# Patient Record
Sex: Female | Born: 1960 | Race: White | Hispanic: No | Marital: Single | Smoking: Former smoker
Health system: Southern US, Community
[De-identification: ages and names within clinical notes are randomized; demographics above are authoritative.]

## PROBLEM LIST (undated history)

## (undated) DIAGNOSIS — J45909 Unspecified asthma, uncomplicated: Secondary | ICD-10-CM

## (undated) DIAGNOSIS — T7840XA Allergy, unspecified, initial encounter: Secondary | ICD-10-CM

## (undated) DIAGNOSIS — Z87442 Personal history of urinary calculi: Secondary | ICD-10-CM

## (undated) DIAGNOSIS — G4730 Sleep apnea, unspecified: Secondary | ICD-10-CM

## (undated) DIAGNOSIS — M1990 Unspecified osteoarthritis, unspecified site: Secondary | ICD-10-CM

## (undated) HISTORY — PX: OTHER SURGICAL HISTORY: SHX169

## (undated) HISTORY — DX: Unspecified osteoarthritis, unspecified site: M19.90

## (undated) HISTORY — DX: Unspecified asthma, uncomplicated: J45.909

## (undated) HISTORY — PX: FRACTURE SURGERY: SHX138

## (undated) HISTORY — PX: HERNIA REPAIR: SHX51

## (undated) HISTORY — DX: Allergy, unspecified, initial encounter: T78.40XA

## (undated) HISTORY — PX: CHOLECYSTECTOMY: SHX55

---

## 1999-06-13 ENCOUNTER — Other Ambulatory Visit: Admission: RE | Admit: 1999-06-13 | Discharge: 1999-06-13 | Payer: Self-pay | Admitting: Gynecology

## 2000-06-04 ENCOUNTER — Encounter: Payer: Self-pay | Admitting: Orthopaedic Surgery

## 2000-06-05 ENCOUNTER — Ambulatory Visit (HOSPITAL_COMMUNITY): Admission: RE | Admit: 2000-06-05 | Discharge: 2000-06-05 | Payer: Self-pay | Admitting: Orthopaedic Surgery

## 2000-06-05 ENCOUNTER — Encounter: Payer: Self-pay | Admitting: Orthopaedic Surgery

## 2012-02-06 ENCOUNTER — Ambulatory Visit: Payer: Self-pay

## 2012-02-06 ENCOUNTER — Ambulatory Visit: Payer: Self-pay | Admitting: Family Medicine

## 2012-02-06 VITALS — BP 136/74 | HR 102 | Temp 97.1°F | Resp 18 | Ht 67.0 in | Wt 333.8 lb

## 2012-02-06 DIAGNOSIS — M25469 Effusion, unspecified knee: Secondary | ICD-10-CM

## 2012-02-06 DIAGNOSIS — R609 Edema, unspecified: Secondary | ICD-10-CM

## 2012-02-06 DIAGNOSIS — M25569 Pain in unspecified knee: Secondary | ICD-10-CM

## 2012-02-06 NOTE — Patient Instructions (Signed)
See the handout for exercises to start in 2 weeks if improving.  Avoid excessive bending, squatting or twisting of area for next 2 weeks. Wear ace bandage as needed.  Return to the clinic or go to the nearest emergency room if any of your symptoms worsen or new symptoms occur.

## 2012-02-06 NOTE — Progress Notes (Signed)
Subjective:    Patient ID: Wendy Lee, female    DOB: [birth year], 51 y.o.   MRN: [id number]  HPI Wendy Lee is a 51 y.o. female Hx of R knee pain - started 2 and 1/2 weeks ago - tightness in R thigh and upper knee.  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 Now with pain in both calf and knee. Leg is swollen into lower leg by end of day, tight and hard to bend. No locking, giving way. Puffy in knee at end of night.  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 Meniscus tear on L then.   Tx: alleve, ibuprofen - no relief.   No known hx of blood clots. Hx of 4 hernia surgeries - abdominal - last one 3 years ago. No recent prolonged car travel or air travel, no recent calf pain, but swelling into lower leg by end of day. No chest pains or trouble breathing.   Quit smoking 4 years ago - smoked for 10 years prior.   Review of Systems  Constitutional: Negative for fever and chills.  Respiratory: Negative for chest tightness and shortness of breath.   Cardiovascular: Positive for leg swelling (at knee. ). Negative for chest pain.  Musculoskeletal: Positive for joint swelling and arthralgias.  Skin: Negative for color change and rash.       Objective:   Physical Exam  Constitutional: She is oriented to person, place, and time. She appears well-developed and well-nourished.       Overweight/obese.   HENT:  Head: Normocephalic and atraumatic.  Cardiovascular: Normal rate, normal heart sounds and intact distal pulses.   Pulmonary/Chest: Effort normal.  Musculoskeletal:       Right knee: She exhibits decreased range of motion (decreased flexion, near full extension. ), swelling, effusion and bony tenderness. She exhibits no MCL laxity. tenderness found. Medial joint line tenderness noted. No MCL and no patellar tendon tenderness noted.       Right lower leg: She exhibits no swelling and no edema.       Legs: Neurological: She is alert and oriented to person,  place, and time.  Skin: Skin is warm and dry. No rash noted.   [facility] reading (PRIMARY) by  Dr. Neva Seat: min medial djd..    Risks (including but not limited to bleeding and infection), benefits, and alternatives discussed for R knee aspiration and injection. Discussed possible difficulty with access due to body habitius.  Verbal consent obtained after any questions were answered. Landmarks noted, and marked as needed. Inferolateral approach.   Area cleansed with Betadine x3, ethyl chloride spray for topical anesthesia, followed by alcohol swab.  Aspirated with 22 gauge syringe - 50cc clear yellow fluid. Hemostat swap of syringe,  Injected with 1 cc kenalog 40mg , 3cc lidocaine 1%plain .  No complications. Bandage applied. Ace wrap applied.   RTC precautions discussed in regards to injection.      Assessment & Plan:  Wendy Lee is a 51 y.o. female 1. Knee pain  DG Knee 1-2 Views Right  2. Swelling of knee joint  DG Knee 1-2 Views Right   R knee pain and swelling with tightness into thigh. 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 Options discussed including ortho eval, or compression with NSAid, but chose aspiration and injection as above as this is limiting her ability to work. No complications with injection. Slight improvement afterwards.   Handout on meniscus tear given. Modify activity next 2  weeks - recheck if not improved. rtc precautions given.

## 2012-02-12 ENCOUNTER — Telehealth: Payer: Self-pay

## 2012-02-12 NOTE — Telephone Encounter (Signed)
R knee pain and swelling with tightness into thigh. 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 Options discussed including ortho eval, or compression with NSAid, but chose aspiration and injection as above as this is limiting her ability to work. No complications with injection. Slight improvement afterwards.  Handout on meniscus tear given. Modify activity next 2 weeks - recheck if not improved. rtc precautions given.   Please advise, patient requesting meds for knee pain and swelling

## 2012-02-12 NOTE — Telephone Encounter (Signed)
The patient was seen on 02/06/12 by Dr. Neva Seat and is continuing to have knee pain and swelling.  The patient also would like to know how many times can an knee have fluid drawn off of it.  The patient is requesting pain medication for knee pain and states Tylenol and Ibuprofen are not helping enough.  Please call the patient at [phone].

## 2012-02-13 MED ORDER — HYDROCODONE-ACETAMINOPHEN 5-325 MG PO TABS: 1.0000 | ORAL_TABLET | Freq: Four times a day (QID) | ORAL | Status: DC | PRN

## 2012-02-13 NOTE — Telephone Encounter (Signed)
Spoke with Dr. Neva Seat. We can go ahead and manage her discomfort with some pain medication, but he would also like to see her again since she is still having some pain and swelling. Medication printed and at the TL desk.

## 2012-02-13 NOTE — Telephone Encounter (Signed)
Spoke with Wendy Lee advised Rx at pharmacy. Advised to recheck with Dr Neva Seat since she is still having pain. Wendy Lee understood

## 2012-02-25 ENCOUNTER — Ambulatory Visit (INDEPENDENT_AMBULATORY_CARE_PROVIDER_SITE_OTHER): Payer: BC Managed Care – PPO | Admitting: Family Medicine

## 2012-02-25 VITALS — BP 138/86 | HR 72 | Temp 98.0°F | Resp 16 | Ht 67.0 in | Wt 334.0 lb

## 2012-02-25 DIAGNOSIS — M1710 Unilateral primary osteoarthritis, unspecified knee: Secondary | ICD-10-CM

## 2012-02-25 DIAGNOSIS — M25469 Effusion, unspecified knee: Secondary | ICD-10-CM

## 2012-02-25 DIAGNOSIS — M25569 Pain in unspecified knee: Secondary | ICD-10-CM

## 2012-02-25 DIAGNOSIS — IMO0002 Reserved for concepts with insufficient information to code with codable children: Secondary | ICD-10-CM

## 2012-02-25 MED ORDER — MELOXICAM 7.5 MG PO TABS: 7.5000 mg | ORAL_TABLET | Freq: Every day | ORAL | Status: DC

## 2012-02-25 MED ORDER — HYDROCODONE-ACETAMINOPHEN 5-325 MG PO TABS: 1.0000 | ORAL_TABLET | Freq: Four times a day (QID) | ORAL | Status: DC | PRN

## 2012-02-25 NOTE — Progress Notes (Signed)
Subjective:    Patient ID: Wendy Lee, female    DOB: October 17, 1960, 51 y.o.   MRN: [id number]  HPI Wendy Lee is a 51 y.o. female  Seen 02/06/12 with R knee pain for few weeks. R knee pain and swelling with tightness into thigh. 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 Options discussed including ortho eval, or compression with NSAid, but chose aspiration and injection as pain  limiting her ability to work. Kenalog 40mg  given after aspiration. Some initial improvement, then pain meds called in. Handout on meniscus tear given las ov and recommended to modify activity next 2 weeks.  XR report: IMPRESSION:  1. Suprapatellar joint effusion, otherwise, no acute findings.  2. Mild tricompartmental degenerative change, worse within the  medial compartment.    Here for follow up. Continued pain, but had eased up somewhat.  Since about 10/25 - notes feeling like it is going to give way.  Only notes with 1st standing - able to walk ok otherwise.  Taking pain med at night only (hydrocodone).  Ace bandage - tried to use this - but it is falling down due to size of knee.   Occasional ibuprofen past few days.      Review of Systems  Constitutional: Negative for fever and chills.  Musculoskeletal: Positive for joint swelling and arthralgias.  Skin: Negative for color change and rash.       Objective:   Physical Exam  Constitutional: She is oriented to person, place, and time. She appears well-developed and well-nourished. No distress.  HENT:  Head: Normocephalic and atraumatic.  Pulmonary/Chest: Effort normal.  Musculoskeletal:       Right knee: She exhibits decreased range of motion (90 flex, lacks 5-10 ext. ) and effusion. tenderness found. Medial joint line and lateral joint line tenderness noted. No patellar tendon tenderness noted.  Neurological: She is alert and oriented to person, place, and time.  Skin: Skin is warm and dry. No rash  noted. No erythema.    Risks (including but not limited to bleeding and infection), benefits, and alternatives discussed for R knee aspiration .  Verbal consent obtained after any questions were answered. Landmarks noted, and marked as needed. Area cleansed with Betadine x2, ethyl chloride spray for topical anesthesia, followed by alcohol swab. 20 gauge needle - 31 cc clear yellow fluid with small amount initial blood. Attempted redirect without any further aspirate.  Needle withdrawn.  No complications. Small amount superficial bleeding - EBL less than 0.5cc. Bandage applied.  RTC precautions discussed in regards to injection and s/sx's of infection.        Assessment & Plan:  Wendy Lee is a 51 y.o. female  1. Knee pain  meloxicam (MOBIC) 7.5 MG tablet, Ambulatory referral to Orthopedic Surgery, HYDROcodone-acetaminophen (NORCO/VICODIN) 5-325 MG per tablet  2. Swelling of knee joint    3. Osteoarthritis, knee  meloxicam (MOBIC) 7.5 MG tablet   Likely degenerative meniscus vs oa flair, only min relief with kenalog injection - suspect in part to continued activity. Aspirated as above.  Prior to procedure discussed risks and likelihood swelling will return, especially if not elevating and relative rest  - understanding voiced.  Improved symptomatically after aspiration. New larger ace bandage applied. Refer to ortho, can try mobic, lortab prn only. rtc precations.   Patient Instructions  We will call you about a referral to ortho. You can take the mobic - 1 to 2 each morning only, and hydrocodone up to every  6 hours as needed for more severe pain. Wear ace bandage and elevate knee as much as possible.  Return to the clinic or go to the nearest emergency room if any of your symptoms worsen or new symptoms occur.Marland Kitchen

## 2012-02-25 NOTE — Patient Instructions (Signed)
We will call you about a referral to ortho. You can take the mobic - 1 to 2 each morning only, and hydrocodone up to every 6 hours as needed for more severe pain. Wear ace bandage and elevate knee as much as possible.  Return to the clinic or go to the nearest emergency room if any of your symptoms worsen or new symptoms occur.Marland Kitchen

## 2012-03-04 ENCOUNTER — Other Ambulatory Visit: Payer: Self-pay | Admitting: Orthopedic Surgery

## 2012-03-04 DIAGNOSIS — R531 Weakness: Secondary | ICD-10-CM

## 2012-03-04 DIAGNOSIS — R609 Edema, unspecified: Secondary | ICD-10-CM

## 2012-03-04 DIAGNOSIS — M25561 Pain in right knee: Secondary | ICD-10-CM

## 2012-03-05 ENCOUNTER — Other Ambulatory Visit: Payer: Self-pay | Admitting: Physician Assistant

## 2012-03-11 ENCOUNTER — Ambulatory Visit
Admission: RE | Admit: 2012-03-11 | Discharge: 2012-03-11 | Disposition: A | Discharge status: Home / Self Care | Payer: BC Managed Care – PPO | Source: Ambulatory Visit | Attending: Orthopedic Surgery | Admitting: Orthopedic Surgery

## 2012-03-11 DIAGNOSIS — R609 Edema, unspecified: Secondary | ICD-10-CM

## 2012-03-11 DIAGNOSIS — R531 Weakness: Secondary | ICD-10-CM

## 2012-03-11 DIAGNOSIS — M25561 Pain in right knee: Secondary | ICD-10-CM

## 2012-03-27 ENCOUNTER — Ambulatory Visit (INDEPENDENT_AMBULATORY_CARE_PROVIDER_SITE_OTHER): Payer: BC Managed Care – PPO | Admitting: Family Medicine

## 2012-03-27 VITALS — BP 131/89 | HR 82 | Temp 98.0°F | Resp 18 | Ht 67.5 in | Wt 332.8 lb

## 2012-03-27 DIAGNOSIS — M25569 Pain in unspecified knee: Secondary | ICD-10-CM

## 2012-03-27 MED ORDER — TRIAMCINOLONE ACETONIDE 40 MG/ML IJ SUSP
40.0000 mg | Freq: Once | INTRAMUSCULAR | Status: AC
  Administered 2012-03-27: 40 mg via INTRA_ARTICULAR

## 2012-03-27 MED ORDER — HYDROCODONE-ACETAMINOPHEN 5-325 MG PO TABS: 1.0000 | ORAL_TABLET | Freq: Four times a day (QID) | ORAL | Status: DC | PRN

## 2012-03-27 NOTE — Progress Notes (Signed)
51 yo with persistent right knee pain who is waiting for surgery by Dr. Renato Gails in January.  She woke up this morning with more pain.  She delivers oxygen tanks for a living.  A couple months ago, she was delivering a hoyer lift and her right leg went through a rotten floor board, but she was afraid to claim worker's comp for fear of losing job.  Her MRI shows a stress fracture above and below the knee, but the pain is in the knee joint proper where the MRI showed a torn cartilage   Objective:  NAD Knee (right) exam shows small effusion, no tenderness, full range of motion.  After permission from patient, area of lateral knee prepped with betadine. Lateral right knee was anesthetized with xylocaine subq Aspiration of the knee was attempted but no fluid was forthcoming Kenalog/marcaine mix was them injected without problem No complications.  Assessment:  Meniscal tear with persistent pain  Plan:  Call if pain not responding. 1. Knee pain  HYDROcodone-acetaminophen (NORCO/VICODIN) 5-325 MG per tablet, triamcinolone acetonide (KENALOG-40) injection 40 mg

## 2012-04-06 ENCOUNTER — Other Ambulatory Visit: Payer: Self-pay | Admitting: Orthopedic Surgery

## 2012-04-14 ENCOUNTER — Encounter (HOSPITAL_COMMUNITY): Payer: Self-pay | Admitting: Pharmacy Technician

## 2012-04-19 NOTE — Pre-Procedure Instructions (Signed)
22 Wendy Lee  04/19/2012   Your procedure is scheduled on: Tuesday, January 7th   Report to [facility] at 5:30 AM.  Call this number if you have problems the morning of surgery: [phone]   Remember:   Do not eat food or drink any liquids:After Midnight Monday.    Take these medicines the morning of surgery with A SIP OF WATER: Hydrocodone   Do not wear jewelry, make-up or nail polish.  Do not wear lotions, powders, or perfumes. You may NOT  wear deodorant.  LADIES -- Do not shave 48 hours prior to surgery.               Do not bring valuables to the hospital.  Contacts, dentures or bridgework may not be worn into surgery.  Leave suitcase in the car. After surgery it may be brought to your room.   For patients admitted to the hospital, checkout time is 11:00 AM the day of discharge.   Patients discharged the day of surgery will not be allowed to drive home AND a responsible person will need to stay with you for the             First 24 hrs after surgery. .  Name and phone number of your driver:    Special Instructions: Shower using CHG 2 nights before surgery and the night before surgery.  If you shower the day of surgery use CHG.  Use special wash - you have one bottle of CHG for all showers.  You should use approximately 1/3 of the bottle for each shower.   Please read over the following fact sheets that you were given: Pain Booklet, MRSA Information and Surgical Site Infection Prevention

## 2012-04-21 ENCOUNTER — Encounter (HOSPITAL_COMMUNITY): Payer: Self-pay

## 2012-04-21 ENCOUNTER — Encounter (HOSPITAL_COMMUNITY)
Admission: RE | Admit: 2012-04-21 | Discharge: 2012-04-21 | Disposition: A | Discharge status: Home / Self Care | Payer: Worker's Compensation | Source: Ambulatory Visit | Attending: Orthopedic Surgery | Admitting: Orthopedic Surgery

## 2012-04-21 HISTORY — DX: Sleep apnea, unspecified: G47.30

## 2012-04-21 LAB — CBC
HCT: 42.8 % (ref 36.0–46.0)
Hemoglobin: 14.4 g/dL (ref 12.0–15.0)
MCH: 29.8 pg (ref 26.0–34.0)
MCHC: 33.6 g/dL (ref 30.0–36.0)
MCV: 88.4 fL (ref 78.0–100.0)
Platelets: 205 K/uL (ref 150–400)
RBC: 4.84 MIL/uL (ref 3.87–5.11)
RDW: 13.2 % (ref 11.5–15.5)
WBC: 8.7 K/uL (ref 4.0–10.5)

## 2012-04-21 LAB — SURGICAL PCR SCREEN
MRSA, PCR: NEGATIVE
Staphylococcus aureus: POSITIVE — AB

## 2012-04-21 LAB — HCG, SERUM, QUALITATIVE: Preg, Serum: NEGATIVE

## 2012-04-21 NOTE — Progress Notes (Signed)
09:30   PT HAS NO PCP...BUT DID HAVE SLEEP STUDY DONE June OR July OF 2013.  I HAVE CALLED GEORGE LARSEN ([phone]) FOR COPY AND SETTINGS.  "YRS AGO" SHE HAD VENTRAL HERNIA REPAIR DONE.  HAD TO HAVE WOUND VAC, AND MULTIPLE ASPIRATIONS AND I & D'S DONE..SHE WASN'T SURE IF THEY DID CULTURES AND IF ANYTHING GREW...BUT SHE DOES HAVE AREA AT THE BOTTOM END OF INCISION THAT LOOKS RAW AND 'GAPED' OPEN.  NO DRAINAGE HAS BEEN NOTED BY PATIENT, AND I DID CALL DR. Diamantina Providence PA AND EXPLAINED THE FINDING. SHE SAID THAT SINCE IT WAS JUST 'A SCOPE, AND NOT AN REPLACEMENT, WE SHOULD BE GOOD TO GO".Marland KitchenDA

## 2012-04-25 MED ORDER — CLINDAMYCIN PHOSPHATE 900 MG/50ML IV SOLN
900.0000 mg | INTRAVENOUS | Status: AC
  Administered 2012-04-26: 900 mg via INTRAVENOUS
  Filled 2012-04-25: qty 50

## 2012-04-26 ENCOUNTER — Encounter (HOSPITAL_COMMUNITY): Payer: Self-pay | Admitting: Anesthesiology

## 2012-04-26 ENCOUNTER — Encounter (HOSPITAL_COMMUNITY)
Admission: RE | Disposition: A | Discharge status: Home / Self Care | Payer: Self-pay | Source: Ambulatory Visit | Attending: Orthopedic Surgery

## 2012-04-26 ENCOUNTER — Ambulatory Visit (HOSPITAL_COMMUNITY): Payer: Worker's Compensation | Admitting: Anesthesiology

## 2012-04-26 ENCOUNTER — Ambulatory Visit (HOSPITAL_COMMUNITY)
Admission: RE | Admit: 2012-04-26 | Discharge: 2012-04-26 | Disposition: A | Discharge status: Home / Self Care | Payer: Worker's Compensation | Source: Ambulatory Visit | Attending: Orthopedic Surgery | Admitting: Orthopedic Surgery

## 2012-04-26 DIAGNOSIS — J45909 Unspecified asthma, uncomplicated: Secondary | ICD-10-CM | POA: Insufficient documentation

## 2012-04-26 DIAGNOSIS — M129 Arthropathy, unspecified: Secondary | ICD-10-CM | POA: Insufficient documentation

## 2012-04-26 DIAGNOSIS — G4730 Sleep apnea, unspecified: Secondary | ICD-10-CM | POA: Insufficient documentation

## 2012-04-26 DIAGNOSIS — Z880 Allergy status to penicillin: Secondary | ICD-10-CM | POA: Insufficient documentation

## 2012-04-26 DIAGNOSIS — S83249A Other tear of medial meniscus, current injury, unspecified knee, initial encounter: Secondary | ICD-10-CM

## 2012-04-26 DIAGNOSIS — Z836 Family history of other diseases of the respiratory system: Secondary | ICD-10-CM | POA: Insufficient documentation

## 2012-04-26 DIAGNOSIS — M23329 Other meniscus derangements, posterior horn of medial meniscus, unspecified knee: Secondary | ICD-10-CM | POA: Insufficient documentation

## 2012-04-26 DIAGNOSIS — Z833 Family history of diabetes mellitus: Secondary | ICD-10-CM | POA: Insufficient documentation

## 2012-04-26 DIAGNOSIS — Z91038 Other insect allergy status: Secondary | ICD-10-CM | POA: Insufficient documentation

## 2012-04-26 DIAGNOSIS — Z9089 Acquired absence of other organs: Secondary | ICD-10-CM | POA: Insufficient documentation

## 2012-04-26 HISTORY — PX: KNEE ARTHROSCOPY: SHX127

## 2012-04-26 SURGERY — Surgical Case
Anesthesia: *Unknown

## 2012-04-26 SURGERY — ARTHROSCOPY, KNEE
Anesthesia: Regional | Site: Knee | Laterality: Right | Wound class: Clean

## 2012-04-26 MED ORDER — ACETAMINOPHEN 10 MG/ML IV SOLN
INTRAVENOUS | Status: AC
  Filled 2012-04-26: qty 100

## 2012-04-26 MED ORDER — MIDAZOLAM HCL 5 MG/5ML IJ SOLN
INTRAMUSCULAR | Status: DC | PRN
  Administered 2012-04-26 (×2): 1 mg via INTRAVENOUS

## 2012-04-26 MED ORDER — HYDROMORPHONE HCL PF 1 MG/ML IJ SOLN: 0.2500 mg | INTRAMUSCULAR | Status: DC | PRN

## 2012-04-26 MED ORDER — HYDROCODONE-ACETAMINOPHEN 10-325 MG PO TABS: 1.0000 | ORAL_TABLET | Freq: Four times a day (QID) | ORAL | Status: DC | PRN

## 2012-04-26 MED ORDER — MORPHINE SULFATE 4 MG/ML IJ SOLN
INTRAMUSCULAR | Status: AC
  Filled 2012-04-26: qty 2

## 2012-04-26 MED ORDER — ASPIRIN EC 325 MG PO TBEC: 325.0000 mg | DELAYED_RELEASE_TABLET | Freq: Every day | ORAL | Status: AC

## 2012-04-26 MED ORDER — CHLORHEXIDINE GLUCONATE 4 % EX LIQD: 60.0000 mL | Freq: Once | CUTANEOUS | Status: DC

## 2012-04-26 MED ORDER — LACTATED RINGERS IV SOLN
INTRAVENOUS | Status: DC | PRN
  Administered 2012-04-26 (×2): via INTRAVENOUS

## 2012-04-26 MED ORDER — SUCCINYLCHOLINE CHLORIDE 20 MG/ML IJ SOLN
INTRAMUSCULAR | Status: DC | PRN
  Administered 2012-04-26: 140 mg via INTRAVENOUS

## 2012-04-26 MED ORDER — SODIUM CHLORIDE 0.9 % IR SOLN
Status: DC | PRN
  Administered 2012-04-26: 3000 mL

## 2012-04-26 MED ORDER — BUPIVACAINE-EPINEPHRINE 0.25% -1:200000 IJ SOLN
INTRAMUSCULAR | Status: AC
  Filled 2012-04-26: qty 1

## 2012-04-26 MED ORDER — ONDANSETRON HCL 4 MG/2ML IJ SOLN: 4.0000 mg | Freq: Once | INTRAMUSCULAR | Status: DC | PRN

## 2012-04-26 MED ORDER — BUPIVACAINE-EPINEPHRINE PF 0.25-1:200000 % IJ SOLN
INTRAMUSCULAR | Status: DC | PRN
  Administered 2012-04-26: 30 mL

## 2012-04-26 MED ORDER — DEXTROSE 5 % IV SOLN
INTRAVENOUS | Status: DC | PRN
  Administered 2012-04-26: 08:00:00 via INTRAVENOUS

## 2012-04-26 MED ORDER — ONDANSETRON HCL 4 MG/2ML IJ SOLN
INTRAMUSCULAR | Status: DC | PRN
  Administered 2012-04-26: 4 mg via INTRAVENOUS

## 2012-04-26 MED ORDER — CLONIDINE HCL (ANALGESIA) 100 MCG/ML EP SOLN
150.0000 ug | Freq: Once | EPIDURAL | Status: DC
  Filled 2012-04-26: qty 1.5

## 2012-04-26 MED ORDER — LIDOCAINE HCL (CARDIAC) 20 MG/ML IV SOLN
INTRAVENOUS | Status: DC | PRN
  Administered 2012-04-26: 40 mg via INTRAVENOUS

## 2012-04-26 MED ORDER — PROPOFOL 10 MG/ML IV BOLUS
INTRAVENOUS | Status: DC | PRN
  Administered 2012-04-26: 170 mg via INTRAVENOUS

## 2012-04-26 MED ORDER — ACETAMINOPHEN 10 MG/ML IV SOLN
1000.0000 mg | Freq: Once | INTRAVENOUS | Status: AC | PRN
  Administered 2012-04-26: 1000 mg via INTRAVENOUS

## 2012-04-26 MED ORDER — MORPHINE SULFATE 4 MG/ML IJ SOLN
INTRAMUSCULAR | Status: DC | PRN
  Administered 2012-04-26: 8 mg via INTRAVENOUS

## 2012-04-26 MED ORDER — CLONIDINE HCL (ANALGESIA) 100 MCG/ML EP SOLN
EPIDURAL | Status: DC | PRN
  Administered 2012-04-26: 100 ug via INTRA_ARTICULAR

## 2012-04-26 MED ORDER — FENTANYL CITRATE 0.05 MG/ML IJ SOLN
INTRAMUSCULAR | Status: DC | PRN
  Administered 2012-04-26: 50 ug via INTRAVENOUS
  Administered 2012-04-26: 100 ug via INTRAVENOUS

## 2012-04-26 SURGICAL SUPPLY — 51 items
BANDAGE ELASTIC 6 VELCRO ST LF (GAUZE/BANDAGES/DRESSINGS) ×1 IMPLANT
BANDAGE ESMARK 6X9 LF (GAUZE/BANDAGES/DRESSINGS) IMPLANT
BLADE CUDA 5.5 (BLADE) IMPLANT
BLADE GREAT WHITE 4.2 (BLADE) IMPLANT
BLADE SURG ROTATE 9660 (MISCELLANEOUS) IMPLANT
BNDG CMPR 9X6 STRL LF SNTH (GAUZE/BANDAGES/DRESSINGS)
BNDG ESMARK 6X9 LF (GAUZE/BANDAGES/DRESSINGS)
BUR OVAL 6.0 (BURR) IMPLANT
CLOTH BEACON ORANGE TIMEOUT ST (SAFETY) ×2 IMPLANT
COVER SURGICAL LIGHT HANDLE (MISCELLANEOUS) ×2 IMPLANT
CUFF TOURNIQUET SINGLE 34IN LL (TOURNIQUET CUFF) IMPLANT
CUFF TOURNIQUET SINGLE 44IN (TOURNIQUET CUFF) ×1 IMPLANT
DRAPE ARTHROSCOPY W/POUCH 114 (DRAPES) ×2 IMPLANT
DRAPE INCISE IOBAN 66X45 STRL (DRAPES) IMPLANT
DRAPE PROXIMA HALF (DRAPES) IMPLANT
DRAPE U-SHAPE 47X51 STRL (DRAPES) ×2 IMPLANT
DRSG PAD ABDOMINAL 8X10 ST (GAUZE/BANDAGES/DRESSINGS) ×1 IMPLANT
DURAPREP 26ML APPLICATOR (WOUND CARE) ×2 IMPLANT
GAUZE XEROFORM 1X8 LF (GAUZE/BANDAGES/DRESSINGS) ×1 IMPLANT
GLOVE BIOGEL PI IND STRL 8 (GLOVE) ×1 IMPLANT
GLOVE BIOGEL PI INDICATOR 8 (GLOVE) ×1
GLOVE SURG ORTHO 8.0 STRL STRW (GLOVE) ×2 IMPLANT
GOWN PREVENTION PLUS XLARGE (GOWN DISPOSABLE) ×3 IMPLANT
GOWN STRL NON-REIN LRG LVL3 (GOWN DISPOSABLE) ×3 IMPLANT
KIT BASIN OR (CUSTOM PROCEDURE TRAY) ×2 IMPLANT
KIT ROOM TURNOVER OR (KITS) ×2 IMPLANT
MANIFOLD NEPTUNE II (INSTRUMENTS) IMPLANT
NDL 18GX1X1/2 (RX/OR ONLY) (NEEDLE) IMPLANT
NDL HYPO 25GX1X1/2 BEV (NEEDLE) ×1 IMPLANT
NEEDLE 18GX1X1/2 (RX/OR ONLY) (NEEDLE) IMPLANT
NEEDLE HYPO 25GX1X1/2 BEV (NEEDLE) ×2 IMPLANT
NS IRRIG 1000ML POUR BTL (IV SOLUTION) IMPLANT
PACK ARTHROSCOPY DSU (CUSTOM PROCEDURE TRAY) ×2 IMPLANT
PAD ARMBOARD 7.5X6 YLW CONV (MISCELLANEOUS) ×4 IMPLANT
PAD CAST 4YDX4 CTTN HI CHSV (CAST SUPPLIES) IMPLANT
PADDING CAST COTTON 4X4 STRL (CAST SUPPLIES) ×2
PADDING CAST COTTON 6X4 STRL (CAST SUPPLIES) ×2 IMPLANT
SET ARTHROSCOPY TUBING (MISCELLANEOUS) ×2
SET ARTHROSCOPY TUBING LN (MISCELLANEOUS) ×1 IMPLANT
SPONGE GAUZE 4X4 12PLY (GAUZE/BANDAGES/DRESSINGS) ×1 IMPLANT
SPONGE LAP 4X18 X RAY DECT (DISPOSABLE) ×2 IMPLANT
SUT ETHILON 3 0 PS 1 (SUTURE) ×1 IMPLANT
SUT MENISCAL KIT (KITS) IMPLANT
SYR 20ML ECCENTRIC (SYRINGE) ×2 IMPLANT
SYR CONTROL 10ML LL (SYRINGE) IMPLANT
SYR TB 1ML LUER SLIP (SYRINGE) ×2 IMPLANT
TOWEL OR 17X24 6PK STRL BLUE (TOWEL DISPOSABLE) ×2 IMPLANT
TOWEL OR 17X26 10 PK STRL BLUE (TOWEL DISPOSABLE) ×2 IMPLANT
TUBE CONNECTING 12X1/4 (SUCTIONS) ×2 IMPLANT
WAND 90 DEG TURBOVAC W/CORD (SURGICAL WAND) ×2 IMPLANT
WATER STERILE IRR 1000ML POUR (IV SOLUTION) ×2 IMPLANT

## 2012-04-26 NOTE — Transfer of Care (Signed)
Immediate Anesthesia Transfer of Care Note  Patient: Wendy Lee  Procedure(s) Performed: Procedure(s) (LRB) with comments: ARTHROSCOPY KNEE (Right) - Right Knee Diagnostic Operative Arthroscopy, Partial Medial Meniscectomy  Patient Location: PACU  Anesthesia Type:General  Level of Consciousness: awake, alert , oriented and patient cooperative  Airway & Oxygen Therapy: Patient Spontanous Breathing and Patient connected to face mask oxygen  Post-op Assessment: Report given to PACU RN, Post -op Vital signs reviewed and stable and Patient moving all extremities  Post vital signs: Reviewed and stable  Complications: No apparent anesthesia complications

## 2012-04-26 NOTE — Anesthesia Procedure Notes (Signed)
Procedure Name: Intubation Date/Time: 04/26/2012 7:53 AM Performed by: Marni Griffon Pre-anesthesia Checklist: Patient identified, Emergency Drugs available, Suction available and Patient being monitored Patient Re-evaluated:Patient Re-evaluated prior to inductionOxygen Delivery Method: Circle system utilized Preoxygenation: Pre-oxygenation with 100% oxygen Intubation Type: IV induction Ventilation: Two handed mask ventilation required Grade View: Grade II Tube type: Oral Tube size: 7.5 mm Airway Equipment and Method: Stylet Placement Confirmation: ETT inserted through vocal cords under direct vision,  breath sounds checked- equal and bilateral and positive ETCO2 Secured at: 21 (cm at teeth) cm Tube secured with: Tape Dental Injury: Teeth and Oropharynx as per pre-operative assessment

## 2012-04-26 NOTE — H&P (Signed)
Wendy Lee is an 52 y.o. female.   Chief Complaint: Right knee pain  HPI: Wendy Lee is a 52 year old female who injured her right knee several months ago. She reports pain and difficulty weightbearing particularly on the medial side of the right knee since that time. She's had an MRI scan which shows radial tear the meniscus along with insufficiency fracture medial femoral condyle. She presents now for operative management after expiration risk and benefits.  Past Medical History  Diagnosis Date  . Arthritis   . Asthma   . Allergy   . Sleep apnea     BPAP    Past Surgical History  Procedure Date  . Cholecystectomy   . Left knee     MENISCUS  . Left foot   . Fracture surgery     LEFT FOOT & KNEE  . Hernia repair     VENTRAL HERNIA    Family History  Problem Relation Age of Onset  . COPD Mother   . Diabetes Maternal Grandfather    Social History:  reports that she has quit smoking. She has never used smokeless tobacco. She reports that she does not drink alcohol or use illicit drugs.  Allergies:  Allergies  Allergen Reactions  . Bee Venom Anaphylaxis  . Penicillins Anaphylaxis    No prescriptions prior to admission    No results found for this or any previous visit (from the past 48 hour(s)). No results found.  Review of Systems  Constitutional: Negative.   HENT: Negative.   Eyes: Negative.   Respiratory: Negative.   Cardiovascular: Negative.   Gastrointestinal: Negative.   Genitourinary: Negative.   Musculoskeletal: Positive for joint pain.  Skin: Negative.   Neurological: Negative.   Endo/Heme/Allergies: Negative.   Psychiatric/Behavioral: Negative.     There were no vitals taken for this visit. Physical Exam  Constitutional: She appears well-developed.  HENT:  Head: Normocephalic.  Eyes: Pupils are equal, round, and reactive to light.  Neck: Normal range of motion.  Cardiovascular: Normal rate.   Respiratory: Effort normal.  GI: Soft.   examination right knee demonstrates intact extensor mechanism medial joint line tenderness stable collateral crucial ligaments positive memory compression testing palpable pedal pulses full range of motion mild effusion  Assessment/Plan Impression is right knee injury with radial tear medial meniscus and subsequent insufficiency fracture. Plan is for arthroscopy partial medial meniscectomy along with superior breast allow insufficiency fracture to heal. Patient does have sleep apnea and she's done at that time hospital. She uses her CPAP at home and we'll have to be careful with pain medicine post op. Patient understands the risk and benefits of operative intervention including knee stiffness incomplete pain relief and infection. I will have her be partial weightbearing for period of time to 3 weeks after the surgery to allow healing of insufficiency fracture. All questions answered  DEAN,GREGORY SCOTT 04/26/2012, 4:14 AM

## 2012-04-26 NOTE — Anesthesia Preprocedure Evaluation (Addendum)
Anesthesia Evaluation  Patient identified by MRN, date of birth, ID band Patient awake    Reviewed: Allergy & Precautions, H&P , NPO status , Patient's Chart, lab work & pertinent test results, reviewed documented beta blocker date and time   Airway Mallampati: II  Neck ROM: Full    Dental  (+) Teeth Intact, Poor Dentition, Loose and Dental Advisory Given,    Pulmonary  breath sounds clear to auscultation        Cardiovascular Rhythm:Regular Rate:Normal     Neuro/Psych    GI/Hepatic   Endo/Other    Renal/GU      Musculoskeletal   Abdominal (+) + obese,  Abdomen: soft.    Peds  Hematology   Anesthesia Other Findings   Reproductive/Obstetrics                          Anesthesia Physical Anesthesia Plan  ASA: II  Anesthesia Plan: General   Post-op Pain Management:    Induction: Intravenous  Airway Management Planned: Oral ETT  Additional Equipment:   Intra-op Plan:   Post-operative Plan:   Informed Consent: I have reviewed the patients History and Physical, chart, labs and discussed the procedure including the risks, benefits and alternatives for the proposed anesthesia with the patient or authorized representative who has indicated his/her understanding and acceptance.   Dental advisory given  Plan Discussed with: CRNA and Surgeon  Anesthesia Plan Comments:         Anesthesia Quick Evaluation

## 2012-04-26 NOTE — Preoperative (Signed)
Beta Blockers   Reason not to administer Beta Blockers:Not Applicable 

## 2012-04-26 NOTE — Anesthesia Postprocedure Evaluation (Signed)
  Anesthesia Post-op Note  Patient: Wendy Lee  Procedure(s) Performed: Procedure(s) (LRB) with comments: ARTHROSCOPY KNEE (Right) - Right Knee Diagnostic Operative Arthroscopy, Partial Medial Meniscectomy  Patient Location: PACU  Anesthesia Type:General  Level of Consciousness: awake, alert  and oriented  Airway and Oxygen Therapy: Patient Spontanous Breathing and Patient connected to nasal cannula oxygen  Post-op Pain: mild  Post-op Assessment: Post-op Vital signs reviewed, Patient's Cardiovascular Status Stable, Respiratory Function Stable, Patent Airway, Adequate PO intake and Pain level controlled  Post-op Vital Signs: stable  Complications: No apparent anesthesia complications

## 2012-04-26 NOTE — Brief Op Note (Signed)
04/26/2012  8:42 AM  PATIENT:  Wendy Lee  52 y.o. female  PRE-OPERATIVE DIAGNOSIS:  Right Knee Medial Meniscus Tear  POST-OPERATIVE DIAGNOSIS:  Right Knee Medial Meniscus Tear  PROCEDURE:  Procedure(s): ARTHROSCOPY KNEE, partial medial menisectomy  SURGEON:  Surgeon(s): Cammy Copa, MD  ASSISTANT: none  ANESTHESIA:   general  EBL: 5 ml       BLOOD ADMINISTERED: none  DRAINS: none   LOCAL MEDICATIONS USED:  none  SPECIMEN:  No Specimen  COUNTS:  YES  TOURNIQUET:  * Missing tourniquet times found for documented tourniquets in log:  75107 *  DICTATION: .Other Dictation: Dictation Number [id number]  PLAN OF CARE: Discharge to home after PACU  PATIENT DISPOSITION:  PACU - hemodynamically stable

## 2012-04-27 ENCOUNTER — Encounter (HOSPITAL_COMMUNITY): Payer: Self-pay | Admitting: Orthopedic Surgery

## 2012-04-27 NOTE — Op Note (Signed)
NAMEKEONNA, RAETHER                ACCOUNT NO.:  [id number]  MEDICAL RECORD NO.:  [id number]  LOCATION:  [facility]                         FACILITY:  [facility]  PHYSICIAN:  Burnard Bunting, M.D.    DATE OF BIRTH:  [birth year]  DATE OF PROCEDURE:  04/26/2012 DATE OF DISCHARGE:  04/26/2012                              OPERATIVE REPORT   PREOPERATIVE DIAGNOSIS:  Right knee medial meniscal tear.  POSTOPERATIVE DIAGNOSIS:  Right knee medial meniscal tear.  PROCEDURE:  Right knee arthroscopy, partial medial meniscectomy.  SURGEON:  Burnard Bunting, MD  ASSISTANT:  None.  ANESTHESIA:  General endotracheal.  INDICATIONS:  Nabilah is a patient with right knee pain presents for operative management after failed conservative management.  She has a medial meniscal tear along with insufficiency stress reaction in the medial tibial plateau.  She presents now for operative management.  PROCEDURE IN DETAIL:  The patient was brought to operating room, where general endotracheal anesthesia was induced.  Preoperative antibiotics administered.  Time-out was called.  Right leg was prescrubbed with alcohol and Betadine, which was allowed to air dry, prepped with DuraPrep solution, draped in sterile manner.  Anterior inferolateral, anterior inferomedial portals were injected with 10 mL of 0.25% Marcaine with epinephrine.  Portals were then created anterior inferolateral, anterior inferomedial.  Diagnostic arthroscopy was performed.  The patient had general synovitis throughout the knee.  Patellofemoral compartment was intact with mild chondromalacia grade 1 max, grade 2 on the undersurface of the patella and trochlea.  No loose bodies in medial or lateral gutter.  ACL, PCL intact, although there was lot of inflamed synovium around the ACL and PCL.  Lateral compartment of meniscus and femoral articular cartilage was intact.  There were some pus in the tibial articular cartilage, but generally was intact on the  lateral side.  On the medial side, there was a grade 3 chondromalacia over 50% weightbearing surface area of the medial femoral condyle, as well as the medial tibial plateau.  Tear of the posterior horn medial meniscus was encountered.  This involved about 40% anterior-posterior width of the meniscus.  It was trimmed back to stable rims with combination of basket punch and shaver.  Thorough irrigation of the knee joint was performed.  Instruments were removed.  Portals were closed using 3-0 nylon.  Solution of Marcaine, morphine, clonidine injected into the knee.  Bulky wrap applied.  The patient tolerated well without immediate complication.  Transferred to recovery room in stable condition.     Burnard Bunting, M.D.     GSD/MEDQ  D:  04/26/2012  T:  04/27/2012  Job:  [id number]

## 2012-05-11 ENCOUNTER — Telehealth: Payer: Self-pay

## 2012-05-11 NOTE — Telephone Encounter (Signed)
Patient is requesting her ov notes related to her knee, also Disc of x-ray  Needs by four pm today   CBN:  [phone]

## 2012-05-11 NOTE — Telephone Encounter (Signed)
Records and x-rays are ready for pick up. Left message for patient.

## 2012-05-17 ENCOUNTER — Telehealth: Payer: Self-pay

## 2012-05-17 NOTE — Telephone Encounter (Signed)
PT WOULD LIKE TO SPEAK WITH DR GREENE'S NURSE ABOUT AN ILLNESS. STATES IT WASN'T ABOUT MEDICATION PLEASE CALL [phone]

## 2013-02-14 ENCOUNTER — Telehealth: Payer: Self-pay

## 2013-02-14 NOTE — Telephone Encounter (Signed)
Pt is requesting a referral to [facility]  - Dr. August Saucer  Originally filed as workers comp - company has declined as w/c   Best number is [phone]

## 2013-02-14 NOTE — Telephone Encounter (Signed)
Spoke to patient, she states since her knee gave out causing the fall and this claim would be denied. It was sent to ICU team for review. Wendy Lee will let me know if claim is denied.

## 2013-02-14 NOTE — Telephone Encounter (Signed)
Need WC chart.

## 2013-02-14 NOTE — Telephone Encounter (Signed)
Wendy Lee spoke to adjuster and claim not denied, sent for further review.

## 2013-02-16 ENCOUNTER — Telehealth: Payer: Self-pay | Admitting: Radiology

## 2013-02-16 ENCOUNTER — Other Ambulatory Visit: Payer: Self-pay | Admitting: Radiology

## 2013-02-16 DIAGNOSIS — S52121A Displaced fracture of head of right radius, initial encounter for closed fracture: Secondary | ICD-10-CM

## 2013-02-16 NOTE — Telephone Encounter (Signed)
Got a denial from the [employer], it is on your desk, patient needs referral to [facility], soon please,she has an elbow fracture.

## 2013-03-12 ENCOUNTER — Ambulatory Visit (INDEPENDENT_AMBULATORY_CARE_PROVIDER_SITE_OTHER): Payer: BC Managed Care – PPO | Admitting: Emergency Medicine

## 2013-03-12 VITALS — BP 124/74 | HR 92 | Temp 99.2°F | Resp 18 | Ht 67.25 in | Wt 334.0 lb

## 2013-03-12 DIAGNOSIS — S52123A Displaced fracture of head of unspecified radius, initial encounter for closed fracture: Secondary | ICD-10-CM

## 2013-03-12 DIAGNOSIS — S52122A Displaced fracture of head of left radius, initial encounter for closed fracture: Secondary | ICD-10-CM

## 2013-03-12 DIAGNOSIS — M25549 Pain in joints of unspecified hand: Secondary | ICD-10-CM

## 2013-03-12 NOTE — Progress Notes (Signed)
[facility] [address] [place] [address] [phone]  Date:  03/12/2013   Name:  Wendy Lee   DOB:  1960-10-30   MRN:  [id number]  PCP:  No primary provider on file.    Chief Complaint: arm splint   History of Present Illness:  Wendy Lee is a 52 y.o. very pleasant female patient who presents with the following:  Under treatment for a radial head fracture and had a splint change this week.   Splint is too loose.  Has pain in the wrist.  Splint only comes down to the wrist.  No improvement with over the counter medications or other home remedies. Denies other complaint or health concern today.   There are no active problems to display for this patient.   Past Medical History  Diagnosis Date  . Arthritis   . Asthma   . Allergy   . Sleep apnea     BPAP    Past Surgical History  Procedure Laterality Date  . Cholecystectomy    . Left knee      MENISCUS  . Left foot    . Fracture surgery      LEFT FOOT & KNEE  . Hernia repair      VENTRAL HERNIA  . Knee arthroscopy  04/26/2012    Procedure: ARTHROSCOPY KNEE;  Surgeon: Cammy Copa, MD;  Location: [facility] OR;  Service: Orthopedics;  Laterality: Right;  Right Knee Diagnostic Operative Arthroscopy, Partial Medial Meniscectomy    History  Substance Use Topics  . Smoking status: Former Games developer  . Smokeless tobacco: Never Used  . Alcohol Use: No    Family History  Problem Relation Age of Onset  . COPD Mother   . Diabetes Maternal Grandfather     Allergies  Allergen Reactions  . Bee Venom Anaphylaxis  . Penicillins Anaphylaxis    Medication list has been reviewed and updated.  Current Outpatient Prescriptions on File Prior to Visit  Medication Sig Dispense Refill  . HYDROcodone-acetaminophen (NORCO) 10-325 MG per tablet Take 1 tablet by mouth every 6 (six) hours as needed for pain.  40 tablet  0  . meloxicam (MOBIC) 7.5 MG tablet Take 7.5 mg by mouth daily.       No current  facility-administered medications on file prior to visit.    Review of Systems:  As per HPI, otherwise negative.    Physical Examination: Filed Vitals:   03/12/13 1610  BP: 124/74  Pulse: 92  Temp: 99.2 F (37.3 C)  Resp: 18   Filed Vitals:   03/12/13 1610  Height: 5' 7.25" (1.708 m)  Weight: 334 lb (151.501 kg)   Body mass index is 51.93 kg/(m^2). Ideal Body Weight: Weight in (lb) to have BMI = 25: 160.5   GEN: WDWN, NAD, Non-toxic, Alert & Oriented x 3 HEENT: Atraumatic, Normocephalic.  Ears and Nose: No external deformity. EXTR: No clubbing/cyanosis/edema NEURO: Normal gait.  PSYCH: Normally interactive. Conversant. Not depressed or anxious appearing.  Calm demeanor.  Tender wrist right.  Assessment and Plan: Fracture radial head right. Splint change   Signed,  Phillips Odor, MD

## 2013-11-09 ENCOUNTER — Ambulatory Visit (INDEPENDENT_AMBULATORY_CARE_PROVIDER_SITE_OTHER): Payer: BC Managed Care – PPO | Admitting: Family Medicine

## 2013-11-09 VITALS — BP 120/80 | HR 67 | Temp 98.2°F | Resp 16 | Ht 67.0 in | Wt 333.0 lb

## 2013-11-09 DIAGNOSIS — R3129 Other microscopic hematuria: Secondary | ICD-10-CM

## 2013-11-09 DIAGNOSIS — L98491 Non-pressure chronic ulcer of skin of other sites limited to breakdown of skin: Secondary | ICD-10-CM

## 2013-11-09 DIAGNOSIS — R1032 Left lower quadrant pain: Secondary | ICD-10-CM

## 2013-11-09 DIAGNOSIS — L98499 Non-pressure chronic ulcer of skin of other sites with unspecified severity: Secondary | ICD-10-CM

## 2013-11-09 DIAGNOSIS — N201 Calculus of ureter: Secondary | ICD-10-CM

## 2013-11-09 DIAGNOSIS — R110 Nausea: Secondary | ICD-10-CM

## 2013-11-09 LAB — POCT URINALYSIS DIPSTICK
Bilirubin, UA: NEGATIVE
Glucose, UA: NEGATIVE
KETONES UA: NEGATIVE
LEUKOCYTES UA: NEGATIVE
Nitrite, UA: NEGATIVE
PH UA: 5.5
Spec Grav, UA: 1.025
Urobilinogen, UA: 0.2

## 2013-11-09 LAB — POCT CBC
GRANULOCYTE PERCENT: 64.1 % (ref 37–80)
HEMATOCRIT: 42.5 % (ref 37.7–47.9)
Hemoglobin: 13.9 g/dL (ref 12.2–16.2)
Lymph, poc: 2.7 (ref 0.6–3.4)
MCH, POC: 29.3 pg (ref 27–31.2)
MCHC: 32.8 g/dL (ref 31.8–35.4)
MCV: 89.3 fL (ref 80–97)
MID (cbc): 0.7 (ref 0–0.9)
MPV: 9.9 fL (ref 0–99.8)
POC Granulocyte: 6.1 (ref 2–6.9)
POC LYMPH %: 28.5 % (ref 10–50)
POC MID %: 7.4 %M (ref 0–12)
Platelet Count, POC: 147 10*3/uL (ref 142–424)
RBC: 4.76 M/uL (ref 4.04–5.48)
RDW, POC: 13.1 %
WBC: 9.5 10*3/uL (ref 4.6–10.2)

## 2013-11-09 LAB — POCT UA - MICROSCOPIC ONLY
CASTS, UR, LPF, POC: NEGATIVE
Crystals, Ur, HPF, POC: NEGATIVE
MUCUS UA: NEGATIVE
YEAST UA: NEGATIVE

## 2013-11-09 MED ORDER — METRONIDAZOLE 500 MG PO TABS: 500.0000 mg | ORAL_TABLET | Freq: Two times a day (BID) | ORAL | Status: DC

## 2013-11-09 MED ORDER — ONDANSETRON 4 MG PO TBDP: ORAL_TABLET | ORAL | Status: DC

## 2013-11-09 MED ORDER — CIPROFLOXACIN HCL 500 MG PO TABS: 500.0000 mg | ORAL_TABLET | Freq: Two times a day (BID) | ORAL | Status: DC

## 2013-11-09 NOTE — Progress Notes (Addendum)
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 No nausea vomiting. She's had some diarrhea for the last 2 or 3 days. She is not sexually involved. Does not have any vaginal symptoms. She still occasionally has some spotting from her menstrual cycle, think she has been going through menopause. However the last few days she's felt sweaty and possibly feverish, didn't think he was just chills from the menopause. She has had 4 ventral abdominal hernia surgeries. She has a chronic nonhealing wound where the bellybutton was. Has never had a colonoscopy.  Objective: Morbidly obese lady in no acute distress. No CVA tenderness. Abdomen has bowel sounds present. She has a chronic ulceration in the scar where she used to have an umbilicus. That area is approximately 2 x 3 cm. She points to the left lower quadrant is the main area of tenderness. Abdomen is soft without palpable masses. She is moderately tender in the left lower quadrant. Not tender down into the thigh. We discussed pelvic exams, but I do not feel like he would benefit from being in examining the left lower quadrant of her morbid obesity that I would not be feel anything there. A scan will be needed if symptoms persist.  Assessment: Left lower quadrant abdominal pain, suspicious for diverticulitis Morbid obesity Chronic umbilical ulceration   Plan: CBC, urinalysis  Results for orders placed in visit on 11/09/13  POCT URINALYSIS DIPSTICK      Result Value Ref Range   Color, UA yellow     Clarity, UA cloudy     Glucose, UA neg     Bilirubin, UA neg     Ketones, UA neg     Spec Grav, UA 1.025     Blood, UA large     pH, UA 5.5     Protein, UA trace     Urobilinogen, UA 0.2     Nitrite, UA neg     Leukocytes, UA Negative    POCT UA - MICROSCOPIC ONLY      Result Value Ref Range   WBC, Ur, HPF, POC 0-2     RBC, urine,  microscopic 4-6     Bacteria, U Microscopic small     Mucus, UA neg     Epithelial cells, urine per micros 0-2     Crystals, Ur, HPF, POC neg     Casts, Ur, LPF, POC neg     Yeast, UA neg    POCT CBC      Result Value Ref Range   WBC 9.5  4.6 - 10.2 K/uL   Lymph, poc 2.7  0.6 - 3.4   POC LYMPH PERCENT 28.5  10 - 50 %L   MID (cbc) 0.7  0 - 0.9   POC MID % 7.4  0 - 12 %M   POC Granulocyte 6.1  2 - 6.9   Granulocyte percent 64.1  37 - 80 %G   RBC 4.76  4.04 - 5.48 M/uL   Hemoglobin 13.9  12.2 - 16.2 g/dL   HCT, POC 42.5  37.7 - 47.9 %   MCV 89.3  80 - 97 fL   MCH, POC 29.3  27 - 31.2 pg   MCHC 32.8  31.8 - 35.4 g/dL   RDW, POC 13.1     Platelet Count, POC 147  142 - 424 K/uL   MPV 9.9  0 - 99.8 fL  This is most likely a diverticulitis, though it could be a kidney stone with a mild pyuria. Will get a CT of the abdomen and pelvis tomorrow using a kidney stone protocol.  Change from her clindamycin, which might have been adequate coverage, to Cipro and Flagyl for urinary and diverticulitis coverage. Her teeth are doing better, which is what she was on the clindamycin for.  Report does show that she has a 6 mm kidney stone at the left ureterovesicular junction. Will try some Flomax. Return if it worse at anytime this weekend. If she has not passed a stone by Monday evening she is to return and see me.

## 2013-11-09 NOTE — Patient Instructions (Addendum)
Drink lots of fluids  Take pain pills every 4-6 hours as needed  Take the ciprofloxacin one twice daily  Take the metronidazole one twice daily  Hold the clindamycin  Take the Zofran every 6 hours if needed for nausea  A CT scan should be scheduled for you for tomorrow. If you do not hear from our office by late morning please call and asked to speak to the team leader or referrals and see if you can find out if it is been scheduled yet.  If you get a proper worse go to the emergency room  Stay off work tomorrow  Return Saturday for a recheck if no better, sooner for if worse

## 2013-11-10 ENCOUNTER — Ambulatory Visit (HOSPITAL_COMMUNITY)
Admission: RE | Admit: 2013-11-10 | Discharge: 2013-11-10 | Disposition: A | Discharge status: Home / Self Care | Payer: BC Managed Care – PPO | Source: Ambulatory Visit | Attending: Family Medicine | Admitting: Family Medicine

## 2013-11-10 ENCOUNTER — Encounter (HOSPITAL_COMMUNITY): Payer: Self-pay

## 2013-11-10 DIAGNOSIS — I7090 Unspecified atherosclerosis: Secondary | ICD-10-CM | POA: Insufficient documentation

## 2013-11-10 DIAGNOSIS — R110 Nausea: Secondary | ICD-10-CM

## 2013-11-10 DIAGNOSIS — N1330 Unspecified hydronephrosis: Secondary | ICD-10-CM | POA: Insufficient documentation

## 2013-11-10 DIAGNOSIS — R3129 Other microscopic hematuria: Secondary | ICD-10-CM

## 2013-11-10 DIAGNOSIS — D259 Leiomyoma of uterus, unspecified: Secondary | ICD-10-CM | POA: Insufficient documentation

## 2013-11-10 DIAGNOSIS — L98491 Non-pressure chronic ulcer of skin of other sites limited to breakdown of skin: Secondary | ICD-10-CM

## 2013-11-10 DIAGNOSIS — I2510 Atherosclerotic heart disease of native coronary artery without angina pectoris: Secondary | ICD-10-CM | POA: Insufficient documentation

## 2013-11-10 DIAGNOSIS — Z9089 Acquired absence of other organs: Secondary | ICD-10-CM | POA: Insufficient documentation

## 2013-11-10 DIAGNOSIS — R1032 Left lower quadrant pain: Secondary | ICD-10-CM

## 2013-11-10 DIAGNOSIS — R918 Other nonspecific abnormal finding of lung field: Secondary | ICD-10-CM | POA: Insufficient documentation

## 2013-11-10 DIAGNOSIS — N201 Calculus of ureter: Secondary | ICD-10-CM | POA: Insufficient documentation

## 2013-11-10 DIAGNOSIS — K439 Ventral hernia without obstruction or gangrene: Secondary | ICD-10-CM | POA: Insufficient documentation

## 2013-11-10 DIAGNOSIS — K7689 Other specified diseases of liver: Secondary | ICD-10-CM | POA: Insufficient documentation

## 2013-11-10 MED ORDER — TAMSULOSIN HCL 0.4 MG PO CAPS: 0.4000 mg | ORAL_CAPSULE | Freq: Every day | ORAL | Status: DC

## 2013-11-10 MED ORDER — IOHEXOL 300 MG/ML  SOLN
50.0000 mL | Freq: Once | INTRAMUSCULAR | Status: AC | PRN
  Administered 2013-11-10: 50 mL via ORAL

## 2013-11-10 MED ORDER — IOHEXOL 300 MG/ML  SOLN
125.0000 mL | Freq: Once | INTRAMUSCULAR | Status: AC | PRN
  Administered 2013-11-10: 125 mL via INTRAVENOUS

## 2013-11-10 NOTE — Addendum Note (Signed)
Addended by: Constance Goltz on: 11/10/2013 08:20 AM   Modules accepted: Orders

## 2013-11-10 NOTE — Addendum Note (Signed)
Addended by: Nayan Proch H on: 11/10/2013 04:09 PM   Modules accepted: Orders

## 2013-11-11 LAB — URINE CULTURE

## 2013-11-13 ENCOUNTER — Ambulatory Visit (INDEPENDENT_AMBULATORY_CARE_PROVIDER_SITE_OTHER): Payer: BC Managed Care – PPO | Admitting: Family Medicine

## 2013-11-13 VITALS — BP 140/80 | HR 73 | Temp 98.0°F | Resp 18 | Ht 67.0 in | Wt 333.0 lb

## 2013-11-13 DIAGNOSIS — Z91038 Other insect allergy status: Secondary | ICD-10-CM

## 2013-11-13 DIAGNOSIS — Z91030 Bee allergy status: Secondary | ICD-10-CM

## 2013-11-13 DIAGNOSIS — N201 Calculus of ureter: Secondary | ICD-10-CM

## 2013-11-13 DIAGNOSIS — R1032 Left lower quadrant pain: Secondary | ICD-10-CM

## 2013-11-13 LAB — WOUND CULTURE
GRAM STAIN: NONE SEEN
GRAM STAIN: NONE SEEN
Gram Stain: NONE SEEN
ORGANISM ID, BACTERIA: NO GROWTH

## 2013-11-13 MED ORDER — EPINEPHRINE 0.3 MG/0.3ML IJ SOAJ: 0.3000 mg | Freq: Once | INTRAMUSCULAR | Status: DC

## 2013-11-13 NOTE — Progress Notes (Signed)
Subjective: Patient continues to have pain in her left lower quadrant. Despite that she did work today. The Flomax made her lightheaded and she quit taking it. She continues to have the pain in the left lower quadrant but no nausea or vomiting. She did not take pain pills today. She passed about 5 little stones but the them away despite instructions to keep them. She has never passed a large stone. The CT scan showed a 8 mm stone at the left UPJ.  Patient has a history of bee sting allergy. Sitting in the lobby she saw about EpiPen. She had anaphylaxis in the past. She would like an EpiPen which is appropriate.  Objective: Mild left lower quadrant tenderness. Mild left CVA tenderness.  Assessment: Left lower quadrant pain secondary to ureterolithiasis History of bee sting allergy  Plan: Refer to urology. Try to get her in the next day or 2. She will probably have this stone extracted. If she is seeing them she does not need to see me back. However if a problem arises am happy to see her. Prescribed EpiPen

## 2013-11-13 NOTE — Patient Instructions (Signed)
Continue antibiotics  Use the EpiPen only in the event of a bee sting  Referral is being made to a urology. We will try and get you in the next day or 2 if possible. In the event that you did abruptly worse before you get in to see them please return or go to the emergency room at [facility]

## 2013-11-17 ENCOUNTER — Encounter (HOSPITAL_COMMUNITY): Payer: Self-pay | Admitting: Pharmacy Technician

## 2013-11-17 ENCOUNTER — Other Ambulatory Visit: Payer: Self-pay | Admitting: Urology

## 2013-11-17 ENCOUNTER — Encounter (HOSPITAL_COMMUNITY): Payer: Self-pay | Admitting: *Deleted

## 2013-11-19 NOTE — H&P (Signed)
Wendy Lee is a 53 year old female patient of Dr. Linna Darner with a left UPJ stone.   History of Present Illness She was experiencing left lower quadrant pain and a CT scan on 11/10/13 revealed a 5.6 mm stone located at the left UVJ with Hounsfield units of ~400 - 500. No renal calculi were noted.   She has never had a stone before. When she began experiencing moderate to severe left lower quadrant pain she was at the dentist office. She experienced diaphoresis and then nausea and vomiting. She was unable to get relief from the pain by positional change. Her father has had kidney stones. She has not seen her stone pass. She has developed some mild irritative symptoms consistent with a distal ureteral stone. She continues to have intermittent left lower quadrant pain and now has also begun to experience some mild intermittent left flank pain.   Past Medical History Problems  1. History of arthritis (V13.4) 2. History of asthma (V12.69) 3. History of sleep apnea (V13.89)  Surgical History Problems  1. History of Arthroscopy Knee 2. History of Cesarean Section 3. History of Cholecystectomy 4. History of Foot Repair 5. History of Ventral Hernia Repair  Current Meds 1. Ciprofloxacin HCl - 500 MG Oral Tablet;  Therapy: (Recorded:31Jul2015) to Recorded 2. EpiPen 0.3 MG/0.3ML DEVI;  Therapy: (Recorded:30Jul2015) to Recorded 3. Flagyl 500 MG Oral Tablet;  Therapy: (Recorded:31Jul2015) to Recorded 4. Meloxicam 7.5 MG Oral Tablet;  Therapy: (Recorded:31Jul2015) to Recorded 5. Tamsulosin HCl - 0.4 MG Oral Capsule;  Therapy: (Recorded:31Jul2015) to Recorded 6. Zofran 4 MG Oral Tablet;  Therapy: (Recorded:31Jul2015) to Recorded  Allergies Medication  1. Penicillins Non-Medication  2. Bee sting  Family History Problems  1. Family history of cardiac disorder (V17.49) : Father, Aunt 2. Family history of chronic obstructive pulmonary disease (V17.6) : Mother 3. Family history of diabetes mellitus  (V18.0) : Maternal Grandfather  Social History Problems    Denied: History of Alcohol use   Caffeine use (V49.89)   Former smoker [place])  Review of Systems Genitourinary, constitutional, skin, eye, otolaryngeal, hematologic/lymphatic, cardiovascular, pulmonary, endocrine, musculoskeletal, gastrointestinal, neurological and psychiatric system(s) were reviewed and pertinent findings if present are noted.  Genitourinary: urinary frequency, dysuria, urinary hesitancy and hematuria.    Vitals Vital Signs  Height: 5 ft 7 in Weight: 333 lb  BMI Calculated: 52.16 BSA Calculated: 2.51 Blood Pressure: 117 / 59 Heart Rate: 74 Recorded: 30Jul2015 11:06AM  Height: 5 ft 7 in Weight: 333 lb  BMI Calculated: 52.16 BSA Calculated: 2.51  Physical Exam Constitutional: Well nourished and well developed . No acute distress.   ENT:. The ears and nose are normal in appearance.   Neck: The appearance of the neck is normal and no neck mass is present.   Pulmonary: No respiratory distress and normal respiratory rhythm and effort.   Cardiovascular: Heart rate and rhythm are normal . No peripheral edema.   Abdomen: The abdomen is soft and nontender. No masses are palpated. No CVA tenderness. No hernias are palpable. No hepatosplenomegaly noted.   Lymphatics: The femoral and inguinal nodes are not enlarged or tender.   Skin: Normal skin turgor, no visible rash and no visible skin lesions.   Neuro/Psych:. Mood and affect are appropriate.    Results/Data Urine  COLOR YELLOW  APPEARANCE CLEAR  SPECIFIC GRAVITY 1.010  pH 6.0  GLUCOSE NEG mg/dL BILIRUBIN NEG  KETONE NEG mg/dL BLOOD NEG  PROTEIN NEG mg/dL UROBILINOGEN 0.2 mg/dL NITRITE NEG  LEUKOCYTE ESTERASE NEG   Old records  or history reviewed: Notes from Dr. Linna Darner as above.  The following images/tracing/specimen were independently visualized:  CT scan as above.  The following clinical lab reports were reviewed:  UA: Clear with no  red cells seen.  The following radiology reports were reviewed: CT scan.    Assessment   We discussed the management of urinary stones. These options include observation, ureteroscopy, shockwave lithotripsy, and PCNL. We discussed which options are relevant to these particular stones. We discussed the natural history of stones as well as the complications of untreated stones and the impact on quality of life without treatment as well as with each of the above listed treatments. We also discussed the efficacy of each treatment in its ability to clear the stone burden. With any of these management options I discussed the signs and symptoms of infection and the need for emergent treatment should these be experienced. For each option we discussed the ability of each procedure to clear the patient of their stone burden.    For observation I described the risks which include but are not limited to silent renal damage, life-threatening infection, need for emergent surgery, failure to pass stone, and pain.    For ureteroscopy I described the risks which include heart attack, stroke, pulmonary embolus, death, bleeding, infection, damage to contiguous structures, positioning injury, ureteral stricture, ureteral avulsion, ureteral injury, need for ureteral stent, inability to perform ureteroscopy, need for an interval procedure, inability to clear stone burden, stent discomfort and pain.    For shockwave lithotripsy I described the risks which include arrhythmia, kidney contusion, kidney hemorrhage, need for transfusion, long-term risk of diabetes or hypertension, back discomfort, flank ecchymosis, flank abrasion, inability to break up stone, inability to pass stone fragments, Steinstrasse, infection associated with obstructing stones, need for different surgical procedure, need for repeat shockwave lithotripsy, and death.    She has elected to proceed with ureteroscopic management of her stone if it does  not pass in the interim. I have recommended she continue taking tamsulosin for medical expulsive therapy and contact me if she passes her stone. I also told her that she did not need to take any more antibiotics.   Plan   1. Stop antibiotics.  2. Continue tamsulosin.  3. Prescription for Toradol.  4. She will be scheduled for left ureteroscopy.

## 2013-11-20 ENCOUNTER — Encounter (HOSPITAL_COMMUNITY): Payer: Self-pay | Admitting: *Deleted

## 2013-11-20 ENCOUNTER — Ambulatory Visit (HOSPITAL_COMMUNITY)
Admission: RE | Admit: 2013-11-20 | Discharge: 2013-11-20 | Disposition: A | Discharge status: Home / Self Care | Payer: BC Managed Care – PPO | Source: Ambulatory Visit | Attending: Urology | Admitting: Urology

## 2013-11-20 ENCOUNTER — Ambulatory Visit (HOSPITAL_COMMUNITY): Payer: BC Managed Care – PPO

## 2013-11-20 ENCOUNTER — Ambulatory Visit (HOSPITAL_COMMUNITY): Payer: BC Managed Care – PPO | Admitting: Anesthesiology

## 2013-11-20 ENCOUNTER — Encounter (HOSPITAL_COMMUNITY): Payer: BC Managed Care – PPO | Admitting: Anesthesiology

## 2013-11-20 ENCOUNTER — Encounter (HOSPITAL_COMMUNITY)
Admission: RE | Disposition: A | Discharge status: Home / Self Care | Payer: Self-pay | Source: Ambulatory Visit | Attending: Urology

## 2013-11-20 DIAGNOSIS — N201 Calculus of ureter: Secondary | ICD-10-CM | POA: Insufficient documentation

## 2013-11-20 DIAGNOSIS — M129 Arthropathy, unspecified: Secondary | ICD-10-CM | POA: Insufficient documentation

## 2013-11-20 DIAGNOSIS — Z87891 Personal history of nicotine dependence: Secondary | ICD-10-CM | POA: Insufficient documentation

## 2013-11-20 DIAGNOSIS — G4730 Sleep apnea, unspecified: Secondary | ICD-10-CM | POA: Insufficient documentation

## 2013-11-20 DIAGNOSIS — J45909 Unspecified asthma, uncomplicated: Secondary | ICD-10-CM | POA: Insufficient documentation

## 2013-11-20 HISTORY — DX: Personal history of urinary calculi: Z87.442

## 2013-11-20 HISTORY — PX: CYSTOSCOPY WITH RETROGRADE PYELOGRAM, URETEROSCOPY AND STENT PLACEMENT: SHX5789

## 2013-11-20 SURGERY — CYSTOURETEROSCOPY, WITH RETROGRADE PYELOGRAM AND STENT INSERTION
Anesthesia: General | Laterality: Left

## 2013-11-20 MED ORDER — PROPOFOL 10 MG/ML IV BOLUS
INTRAVENOUS | Status: AC
  Filled 2013-11-20: qty 20

## 2013-11-20 MED ORDER — PHENAZOPYRIDINE HCL 200 MG PO TABS: 200.0000 mg | ORAL_TABLET | Freq: Once | ORAL | Status: DC

## 2013-11-20 MED ORDER — LIDOCAINE HCL (CARDIAC) 20 MG/ML IV SOLN
INTRAVENOUS | Status: AC
  Filled 2013-11-20: qty 5

## 2013-11-20 MED ORDER — MIDAZOLAM HCL 5 MG/5ML IJ SOLN
INTRAMUSCULAR | Status: DC | PRN
  Administered 2013-11-20 (×2): 1 mg via INTRAVENOUS

## 2013-11-20 MED ORDER — SODIUM CHLORIDE 0.9 % IR SOLN
Status: DC | PRN
  Administered 2013-11-20: 3000 mL via INTRAVESICAL

## 2013-11-20 MED ORDER — PHENAZOPYRIDINE HCL 200 MG PO TABS: 200.0000 mg | ORAL_TABLET | Freq: Three times a day (TID) | ORAL | Status: DC | PRN

## 2013-11-20 MED ORDER — MEPERIDINE HCL 50 MG/ML IJ SOLN
6.2500 mg | INTRAMUSCULAR | Status: DC | PRN
Start: 2013-11-20 — End: 2013-11-20

## 2013-11-20 MED ORDER — PROPOFOL 10 MG/ML IV BOLUS
INTRAVENOUS | Status: DC | PRN
  Administered 2013-11-20: 250 mg via INTRAVENOUS

## 2013-11-20 MED ORDER — DEXAMETHASONE SODIUM PHOSPHATE 10 MG/ML IJ SOLN
INTRAMUSCULAR | Status: AC
  Filled 2013-11-20: qty 1

## 2013-11-20 MED ORDER — LIDOCAINE HCL (CARDIAC) 20 MG/ML IV SOLN
INTRAVENOUS | Status: DC | PRN
  Administered 2013-11-20: 60 mg via INTRAVENOUS

## 2013-11-20 MED ORDER — ONDANSETRON HCL 4 MG/2ML IJ SOLN
INTRAMUSCULAR | Status: DC | PRN
  Administered 2013-11-20: 4 mg via INTRAVENOUS

## 2013-11-20 MED ORDER — CIPROFLOXACIN IN D5W 400 MG/200ML IV SOLN
INTRAVENOUS | Status: AC
  Filled 2013-11-20: qty 200

## 2013-11-20 MED ORDER — DEXAMETHASONE SODIUM PHOSPHATE 10 MG/ML IJ SOLN
INTRAMUSCULAR | Status: DC | PRN
  Administered 2013-11-20: 10 mg via INTRAVENOUS

## 2013-11-20 MED ORDER — HYDROCODONE-ACETAMINOPHEN 10-325 MG PO TABS: 1.0000 | ORAL_TABLET | ORAL | Status: DC | PRN

## 2013-11-20 MED ORDER — FENTANYL CITRATE 0.05 MG/ML IJ SOLN
INTRAMUSCULAR | Status: AC
  Filled 2013-11-20: qty 5

## 2013-11-20 MED ORDER — MIDAZOLAM HCL 2 MG/2ML IJ SOLN
INTRAMUSCULAR | Status: AC
  Filled 2013-11-20: qty 2

## 2013-11-20 MED ORDER — BELLADONNA ALKALOIDS-OPIUM 16.2-60 MG RE SUPP
RECTAL | Status: AC
  Filled 2013-11-20: qty 1

## 2013-11-20 MED ORDER — OXYCODONE HCL 5 MG/5ML PO SOLN
5.0000 mg | Freq: Once | ORAL | Status: DC | PRN
  Filled 2013-11-20: qty 5

## 2013-11-20 MED ORDER — ONDANSETRON HCL 4 MG/2ML IJ SOLN
INTRAMUSCULAR | Status: AC
  Filled 2013-11-20: qty 2

## 2013-11-20 MED ORDER — FENTANYL CITRATE 0.05 MG/ML IJ SOLN
INTRAMUSCULAR | Status: DC | PRN
  Administered 2013-11-20 (×2): 50 ug via INTRAVENOUS

## 2013-11-20 MED ORDER — CIPROFLOXACIN IN D5W 400 MG/200ML IV SOLN
400.0000 mg | INTRAVENOUS | Status: AC
  Administered 2013-11-20: 400 mg via INTRAVENOUS

## 2013-11-20 MED ORDER — OXYCODONE HCL 5 MG PO TABS: 5.0000 mg | ORAL_TABLET | Freq: Once | ORAL | Status: DC | PRN

## 2013-11-20 MED ORDER — LACTATED RINGERS IV SOLN
INTRAVENOUS | Status: DC
  Administered 2013-11-20: 1000 mL via INTRAVENOUS

## 2013-11-20 MED ORDER — PROMETHAZINE HCL 25 MG/ML IJ SOLN: 6.2500 mg | INTRAMUSCULAR | Status: DC | PRN

## 2013-11-20 MED ORDER — HYDROMORPHONE HCL PF 1 MG/ML IJ SOLN: 0.2500 mg | INTRAMUSCULAR | Status: DC | PRN

## 2013-11-20 SURGICAL SUPPLY — 18 items
BAG URO CATCHER STRL LF (DRAPE) ×3 IMPLANT
BASKET ZERO TIP NITINOL 2.4FR (BASKET) ×2 IMPLANT
BSKT STON RTRVL ZERO TP 2.4FR (BASKET) ×2
CATH INTERMIT  6FR 70CM (CATHETERS) ×3 IMPLANT
CLOTH BEACON ORANGE TIMEOUT ST (SAFETY) ×3 IMPLANT
DRAPE CAMERA CLOSED 9X96 (DRAPES) ×3 IMPLANT
GLOVE BIOGEL M 8.0 STRL (GLOVE) ×3 IMPLANT
GLOVE SURG SS PI 8.0 STRL IVOR (GLOVE) ×3 IMPLANT
GOWN STRL REUS W/ TWL XL LVL3 (GOWN DISPOSABLE) ×2 IMPLANT
GOWN STRL REUS W/TWL XL LVL3 (GOWN DISPOSABLE) ×6 IMPLANT
GUIDEWIRE ANG ZIPWIRE 038X150 (WIRE) IMPLANT
GUIDEWIRE STR DUAL SENSOR (WIRE) ×2 IMPLANT
HOVERMATT HALF SINGLE USE (PATIENT TRANSFER) ×2 IMPLANT
MANIFOLD NEPTUNE II (INSTRUMENTS) ×3 IMPLANT
MARKER SKIN DUAL TIP RULER LAB (MISCELLANEOUS) ×3 IMPLANT
PACK CYSTO (CUSTOM PROCEDURE TRAY) ×3 IMPLANT
SHEATH ACCESS URETERAL 24CM (SHEATH) ×2 IMPLANT
TUBING CONNECTING 10 (TUBING) ×3 IMPLANT

## 2013-11-20 NOTE — Interval H&P Note (Signed)
History and Physical Interval Note:  11/20/2013 11:18 AM  Wendy Lee  has presented today for surgery, with the diagnosis of left ureteral stone  The various methods of treatment have been discussed with the patient and family. After consideration of risks, benefits and other options for treatment, the patient has consented to  Procedure(s): LEFT RETROGRADE PYELOGRAM, LEFT URETEROSCOPY, LASER LITHOTRIPSY AND STENT PLACEMENT (Left) HOLMIUM LASER APPLICATION (N/A) as a surgical intervention .  The patient's history has been reviewed, patient examined, no change in status, stable for surgery.  I have reviewed the patient's chart and labs.  Questions were answered to the patient's satisfaction.     [name]

## 2013-11-20 NOTE — Anesthesia Preprocedure Evaluation (Addendum)
Anesthesia Evaluation  Patient identified by MRN, date of birth, ID band Patient awake    Reviewed: Allergy & Precautions, H&P , NPO status , Patient's Chart, lab work & pertinent test results, reviewed documented beta blocker date and time   Airway Mallampati: II  Neck ROM: Full    Dental  (+) Teeth Intact, Poor Dentition, Loose, Dental Advisory Given,    Pulmonary asthma , sleep apnea , former smoker,  breath sounds clear to auscultation        Cardiovascular negative cardio ROS  Rhythm:Regular Rate:Normal     Neuro/Psych negative neurological ROS  negative psych ROS   GI/Hepatic negative GI ROS, Neg liver ROS,   Endo/Other  Morbid obesity  Renal/GU negative Renal ROS     Musculoskeletal negative musculoskeletal ROS (+)   Abdominal (+) + obese,  Abdomen: soft.    Peds  Hematology negative hematology ROS (+)   Anesthesia Other Findings   Reproductive/Obstetrics negative OB ROS                          Anesthesia Physical  Anesthesia Plan  ASA: III  Anesthesia Plan: General   Post-op Pain Management:    Induction: Intravenous  Airway Management Planned: LMA  Additional Equipment:   Intra-op Plan:   Post-operative Plan: Extubation in OR  Informed Consent: I have reviewed the patients History and Physical, chart, labs and discussed the procedure including the risks, benefits and alternatives for the proposed anesthesia with the patient or authorized representative who has indicated his/her understanding and acceptance.   Dental advisory given  Plan Discussed with: CRNA  Anesthesia Plan Comments:         Anesthesia Quick Evaluation

## 2013-11-20 NOTE — Progress Notes (Signed)
bipap mask and tubing in short stay room 28  Also pt has friend who will pick her up upon discharge

## 2013-11-20 NOTE — Transfer of Care (Signed)
Immediate Anesthesia Transfer of Care Note  Patient: Wendy Lee  Procedure(s) Performed: Procedure(s): LEFT RETROGRADE PYELOGRAM, LEFT URETEROSCOPY, LASER LITHOTRIPSY AND STENT PLACEMENT (Left) HOLMIUM LASER APPLICATION (N/A)  Patient Location: PACU  Anesthesia Type:General  Level of Consciousness: awake, alert  and oriented  Airway & Oxygen Therapy: Patient Spontanous Breathing and Patient connected to face mask oxygen  Post-op Assessment: Report given to PACU RN and Post -op Vital signs reviewed and stable  Post vital signs: Reviewed and stable  Complications: No apparent anesthesia complications

## 2013-11-20 NOTE — Discharge Instructions (Addendum)
Post Bladder Surgery Instructions   General instructions:     Your recent bladder surgery requires very little post hospital care but some definite precautions.  Despite the fact that no skin incisions were used, the area around the bladder incisions are raw and covered with scabs to promote healing and prevent bleeding. Certain precautions are needed to insure that the scabs are not disturbed over the next 2-4 weeks while the healing proceeds.  Because the raw surface inside your bladder and the irritating effects of urine you may expect frequency of urination and/or urgency (a stronger desire to urinate) and perhaps even getting up at night more often. This will usually resolve or improve slowly over the healing period. You may see some blood in your urine over the first 6 weeks. Do not be alarmed, even if the urine was clear for a while. Get off your feet and drink lots of fluids until clearing occurs. If you start to pass clots or don't improve call us.   Diet:  You may return to your normal diet immediately. Because of the raw surface of your bladder, alcohol, spicy foods, foods high in acid and drinks with caffeine may cause irritation or frequency and should be used in moderation. To keep your urine flowing freely and avoid constipation, drink plenty of fluids during the day (8-10 glasses). Tip: Avoid cranberry juice because it is very acidic.  Activity:  Your physical activity doesn't need to be restricted. However, if you are very active, you may see some blood in the urine. We suggest that you reduce your activity under the circumstances until the bleeding has stopped.  Bowels:  It is important to keep your bowels regular during the postoperative period. Straining with bowel movements can cause bleeding. A bowel movement every other day is reasonable. Use a mild laxative if needed, such as milk of magnesia 2-3 tablespoons, or 2 Dulcolax tablets. Call if you continue to have problems.  If you had been taking narcotics for pain, before, during or after your surgery, you may be constipated. Take a laxative if necessary.    Medication:  You should resume your pre-surgery medications unless told not to. In addition you may be given an antibiotic to prevent or treat infection. Antibiotics are not always necessary. All medication should be taken as prescribed until the bottles are finished unless you are having an unusual reaction to one of the drugs.  Followup: Your given a followup appointment preoperatively because of possible need for a ureteral stent which needs to be taken out in the office. Because I did not leave a stent you will be much more comfortable and seemed to have no other stones followup in my office can be as needed. I would however recommend you increase sure water intake as this will decrease your risk of kidney stone formation in the future. In addition you should try to limit salt in your diet as well as excess protein. Finally one thing that you do not need to avoid is calcium. Studies have revealed that female patients who do not take calcium were actually at an increased risk for kidney stone formation. My suggestion however is that she will avoid excessive calcium.

## 2013-11-20 NOTE — Anesthesia Postprocedure Evaluation (Signed)
Anesthesia Post Note  Patient: Wendy Lee  Procedure(s) Performed: Procedure(s) (LRB): LEFT RETROGRADE PYELOGRAM, LEFT URETEROSCOPY, LASER LITHOTRIPSY AND STENT PLACEMENT (Left) HOLMIUM LASER APPLICATION (N/A)  Anesthesia type: General  Patient location: PACU  Post pain: Pain level controlled  Post assessment: Post-op Vital signs reviewed  Last Vitals: BP 135/75  Pulse 67  Temp(Src) 36.7 C (Oral)  Resp 18  Ht 5\' 7"  (1.702 m)  Wt 325 lb 2 oz (147.476 kg)  BMI 50.91 kg/m2  SpO2 99%  LMP 10/20/2013  Post vital signs: Reviewed  Level of consciousness: sedated  Complications: No apparent anesthesia complications

## 2013-11-20 NOTE — Op Note (Signed)
PATIENT:  Wendy Lee  PRE-OPERATIVE DIAGNOSIS:  left Ureteral calculus  POST-OPERATIVE DIAGNOSIS: Same  PROCEDURE:  1. Cystoscopy with left retrograde pyelogram including interpretation. 2. Left ureteroscopy and stone extraction  SURGEON: [name], MD  INDICATION: Ms. Riehle is a 53 year old female who has a left distal ureteral stone. This was noted after she developed colic on that side and a CT scan confirmed the stones present. She was placed on medical expulsive therapy but has not passed her stone. She had pain last night. Her preoperative KUB reveals the density in the area of the left ureteral vesicle junction. He presents for ureteroscopic management of her stone.  ANESTHESIA:  General  EBL:  Minimal  DRAINS: None  SPECIMEN:  Stone given the patient  DESCRIPTION OF PROCEDURE: The patient was taken to the major OR and placed on the table. General anesthesia was administered and then the patient was moved to the dorsal lithotomy position. The genitalia was sterilely prepped and draped. An official timeout was performed.  Initially the 8 French cystoscope with 12 lens was passed under direct vision. The bladder was then entered and fully inspected. It was noted be free of any tumors stones or inflammatory lesions. Ureteral orifices were of normal configuration and position. A 6 French open-ended ureteral catheter was then passed through the cystoscope into the ureteral orifice in order to perform a left retrograde pyelogram.  A retrograde pyelogram was performed by injecting full-strength contrast up the left ureter under direct fluoroscopic control. It revealed a filling defect in the distal ureter consistent with the stone seen on the preoperative KUB. The remainder of the ureter was noted to be normal as was the intrarenal collecting system. I then passed a 0.038 inch floppy-tipped guidewire through the open ended catheter and into the area of the renal pelvis and this  was left in place. The inner portion of a ureteral access sheath was then passed over the guidewire to gently dilate the intramural ureter. I then proceeded with ureteroscopy.  A 6 French rigid ureteroscope was then passed under direct into the bladder and into the left orifice and up the ureter. The stone was identified and felt to be of a size that it could be extracted without the need for laser lithotripsy. I therefore passed a Nitinol basket through the ureteroscope and engaged the stone along its long axis. I drew this up against the scope and extracted the scope and the stone very easily with no resistance. Ureteroscope was removed and I passed the cystoscope with obturator into the bladder, removed the obturator and drained the bladder and then removed the cystoscope sheath. The patient was then awakened and taken to the recovery room in stable and satisfactory condition. She tolerated the procedure well no intraoperative complications.  PLAN OF CARE: Discharge to home after PACU  PATIENT DISPOSITION:  PACU - hemodynamically stable.

## 2013-11-21 ENCOUNTER — Encounter (HOSPITAL_COMMUNITY): Payer: Self-pay | Admitting: Urology

## 2015-08-22 IMAGING — CR DG ABDOMEN 1V
1 series · 1 of 1 positions shown · non-contrast
Comparison: CT 11/10/2013.

CLINICAL DATA: Left ureteral calculus.  Pre lithotripsy.

EXAM:
ABDOMEN - 1 VIEW

[t abdomen supine *]
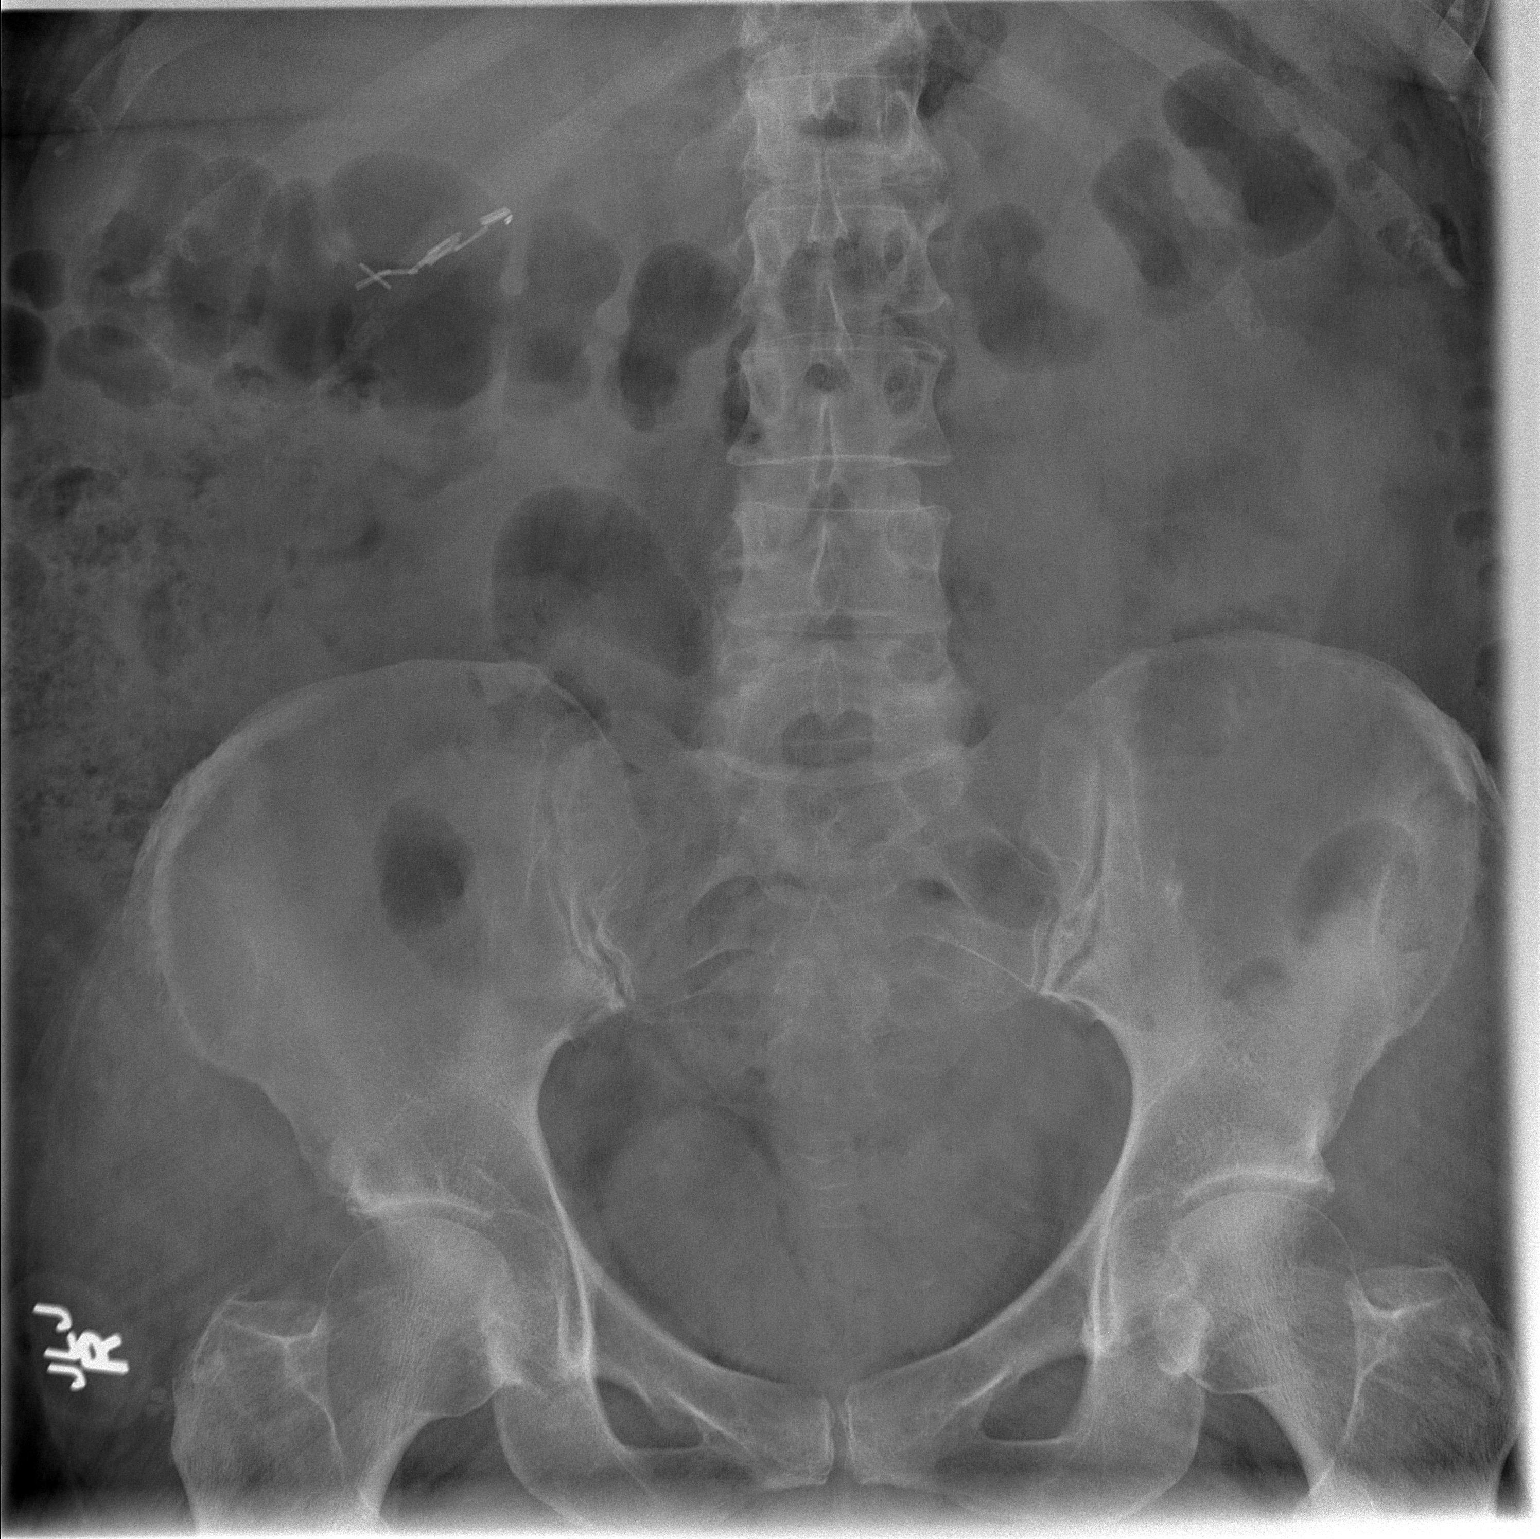

[1 of 1 positions shown; findings below may reference images not displayed]

FINDINGS: The bowel gas pattern is normal. Cholecystectomy clips are noted.
The CT demonstrated a calculus at the left ureterovesical junction,
not clearly visible on the scout image. The current radiograph
demonstrates an ill-defined density in this area which could reflect
a residual calculus. No other suspicious calcifications are
identified. The osseous structures appear normal.
IMPRESSION: Faint density in the left pelvis may reflect the previously
demonstrated distal ureteral calculus.

## 2017-01-21 ENCOUNTER — Ambulatory Visit (INDEPENDENT_AMBULATORY_CARE_PROVIDER_SITE_OTHER): Payer: BLUE CROSS/BLUE SHIELD | Admitting: Physician Assistant

## 2017-01-21 ENCOUNTER — Encounter: Payer: Self-pay | Admitting: Physician Assistant

## 2017-01-21 VITALS — BP 128/78 | HR 67 | Temp 98.4°F | Resp 16 | Ht 67.0 in | Wt 323.2 lb

## 2017-01-21 DIAGNOSIS — R21 Rash and other nonspecific skin eruption: Secondary | ICD-10-CM | POA: Diagnosis not present

## 2017-01-21 MED ORDER — VALACYCLOVIR HCL 1 G PO TABS: 1000.0000 mg | ORAL_TABLET | Freq: Three times a day (TID) | ORAL | 0 refills | Status: AC

## 2017-01-21 MED ORDER — SULFAMETHOXAZOLE-TRIMETHOPRIM 800-160 MG PO TABS: 1.0000 | ORAL_TABLET | Freq: Two times a day (BID) | ORAL | 0 refills | Status: AC

## 2017-01-21 NOTE — Patient Instructions (Addendum)
  Please take medications as prescribed.  Take zyrtec for itching. Come back tomorrow or anytime if the rash is worsening.    IF you received an x-ray today, you will receive an invoice from [facility]. Please contact [facility] at [phone] with questions or concerns regarding your invoice.   IF you received labwork today, you will receive an invoice from [facility]. Please contact [facility] at [phone] with questions or concerns regarding your invoice.   Our billing staff will not be able to assist you with questions regarding bills from these companies.  You will be contacted with the lab results as soon as they are available. The fastest way to get your results is to activate your My Chart account. Instructions are located on the last page of this paperwork. If you have not heard from [place] regarding the results in 2 weeks, please contact this office.

## 2017-01-21 NOTE — Progress Notes (Signed)
01/21/2017 3:10 PM   DOB: [birth year] / MRN: [id number]  SUBJECTIVE:  Wendy Lee is a 56 y.o. female with a history of uncontrolled diabetes presenting for facial rash.  Noticed this this morning and states it is both painful and itching.  Feels she is getting worse.  Is concerned for a black widow spider bite. No fever, chills, nausea.     She is allergic to bee venom and penicillins.   She  has a past medical history of Allergy; Arthritis; Asthma; History of kidney stones; and Sleep apnea.    She  reports that she has quit smoking. She has never used smokeless tobacco. She reports that she does not drink alcohol or use drugs. She  reports that she currently engages in sexual activity. She reports using the following method of birth control/protection: None. The patient  has a past surgical history that includes Cholecystectomy; LEFT KNEE; LEFT FOOT; Fracture surgery; Hernia repair; Knee arthroscopy (04/26/2012); and Cystoscopy with retrograde pyelogram, ureteroscopy and stent placement (Left, 11/20/2013).  Her family history includes COPD in her mother; Diabetes in her maternal grandfather.  Review of Systems  Constitutional: Negative for chills, diaphoresis and fever.  Eyes: Negative for blurred vision, double vision, photophobia, pain, discharge and redness.  Respiratory: Negative for shortness of breath.   Cardiovascular: Negative for chest pain, orthopnea and leg swelling.  Gastrointestinal: Negative for nausea.  Skin: Positive for itching and rash.  Neurological: Negative for dizziness, tingling, tremors, sensory change, speech change, focal weakness and headaches.    The problem list and medications were reviewed and updated by myself where necessary and exist elsewhere in the encounter.   OBJECTIVE:  BP 128/78 (BP Location: Right Arm, Patient Position: Sitting, Cuff Size: Large)   Pulse 67   Temp 98.4 F (36.9 C) (Oral)   Resp 16   Ht 5\' 7"  (1.702 m)   Wt (!) 323 lb 3.2  oz (146.6 kg)   LMP 10/20/2013   SpO2 98%   BMI 50.62 kg/m   Physical Exam  Constitutional: She is active.  Non-toxic appearance.  HENT:  Right Ear: Hearing, tympanic membrane, external ear and ear canal normal.  Left Ear: Hearing, tympanic membrane, external ear and ear canal normal.  Nose: Nose normal. Right sinus exhibits no maxillary sinus tenderness and no frontal sinus tenderness. Left sinus exhibits no maxillary sinus tenderness and no frontal sinus tenderness.  Mouth/Throat: Uvula is midline, oropharynx is clear and moist and mucous membranes are normal. Mucous membranes are not dry. No oropharyngeal exudate, posterior oropharyngeal edema or tonsillar abscesses.  Cardiovascular: Normal rate.   Pulmonary/Chest: Effort normal. No tachypnea.  Lymphadenopathy:       Head (right side): No submandibular and no tonsillar adenopathy present.       Head (left side): No submandibular and no tonsillar adenopathy present.    She has no cervical adenopathy.  Neurological: She is alert.  Skin: Skin is warm and dry. She is not diaphoretic. No pallor.    No results found for this or any previous visit (from the past 72 hour(s)).  No results found.  ASSESSMENT AND PLAN:  Natacia was seen today for belepharitis.  Diagnoses and all orders for this visit:  Facial rash Comments: Early cellulitis versus shingles versus insect bite. Will cover for the former two and advise she take zyrtec for itching.  Orders: -     sulfamethoxazole-trimethoprim (BACTRIM DS,SEPTRA DS) 800-160 MG tablet; Take 1 tablet by mouth 2 (two) times daily. -  valACYclovir (VALTREX) 1000 MG tablet; Take 1 tablet (1,000 mg total) by mouth 3 (three) times daily.    The patient is advised to call or return to clinic if she does not see an improvement in symptoms, or to seek the care of the closest emergency department if she worsens with the above plan.   Philis Fendt, MHS, PA-C [facility] 01/21/2017 3:10 PM

## 2017-05-24 DIAGNOSIS — R229 Localized swelling, mass and lump, unspecified: Secondary | ICD-10-CM | POA: Diagnosis not present

## 2017-05-24 DIAGNOSIS — I10 Essential (primary) hypertension: Secondary | ICD-10-CM | POA: Diagnosis not present

## 2017-05-24 DIAGNOSIS — E1169 Type 2 diabetes mellitus with other specified complication: Secondary | ICD-10-CM | POA: Insufficient documentation

## 2017-05-24 DIAGNOSIS — E785 Hyperlipidemia, unspecified: Secondary | ICD-10-CM

## 2017-05-24 DIAGNOSIS — E1165 Type 2 diabetes mellitus with hyperglycemia: Secondary | ICD-10-CM | POA: Diagnosis not present

## 2017-07-28 DIAGNOSIS — R0789 Other chest pain: Secondary | ICD-10-CM | POA: Diagnosis not present

## 2017-07-28 DIAGNOSIS — F4323 Adjustment disorder with mixed anxiety and depressed mood: Secondary | ICD-10-CM | POA: Diagnosis not present

## 2017-10-10 DIAGNOSIS — S6392XA Sprain of unspecified part of left wrist and hand, initial encounter: Secondary | ICD-10-CM | POA: Diagnosis not present

## 2018-03-12 ENCOUNTER — Ambulatory Visit (INDEPENDENT_AMBULATORY_CARE_PROVIDER_SITE_OTHER): Payer: BLUE CROSS/BLUE SHIELD

## 2018-03-12 ENCOUNTER — Encounter: Payer: Self-pay | Admitting: Osteopathic Medicine

## 2018-03-12 ENCOUNTER — Other Ambulatory Visit: Payer: Self-pay

## 2018-03-12 ENCOUNTER — Ambulatory Visit: Payer: BLUE CROSS/BLUE SHIELD | Admitting: Osteopathic Medicine

## 2018-03-12 VITALS — BP 125/79 | HR 70 | Temp 98.2°F | Resp 16 | Ht 67.5 in | Wt 318.0 lb

## 2018-03-12 DIAGNOSIS — M25511 Pain in right shoulder: Secondary | ICD-10-CM

## 2018-03-12 DIAGNOSIS — E119 Type 2 diabetes mellitus without complications: Secondary | ICD-10-CM | POA: Insufficient documentation

## 2018-03-12 DIAGNOSIS — R0789 Other chest pain: Secondary | ICD-10-CM | POA: Diagnosis not present

## 2018-03-12 DIAGNOSIS — R079 Chest pain, unspecified: Secondary | ICD-10-CM | POA: Diagnosis not present

## 2018-03-12 MED ORDER — PREDNISONE 50 MG PO TABS: ORAL_TABLET | ORAL | 0 refills | Status: AC

## 2018-03-12 MED ORDER — NAPROXEN 500 MG PO TABS: 500.0000 mg | ORAL_TABLET | Freq: Two times a day (BID) | ORAL | 0 refills | Status: DC

## 2018-03-12 NOTE — Patient Instructions (Addendum)
Chest pressure symptoms are concerning! I am reassured that there is no chest pain on exertion (angina) BUT if chest pain/pressure worsens, please seek emergency medical attention! We are getting you set up with a cardiologist for discussion about stress testing.   I think the shoulder problem is due to an impingement/irritation of the biceps tendon or possible inflammation of the bursa which cushions the joint. I'd like to try treatment with anti-inflammatory (Naproxen 500 mg twice daily) and steroid (Prednisone burst) and referral to sports medicine to discuss whether injections might be helpful if this isn't getting better.         If you have lab work done today you will be contacted with your lab results within the next 2 weeks.  If you have not heard from [place] then please contact us. The fastest way to get your results is to register for My Chart.   IF you received an x-ray today, you will receive an invoice from [facility]. Please contact [facility] at [phone] with questions or concerns regarding your invoice.   IF you received labwork today, you will receive an invoice from [place]. Please contact [facility] at [phone] with questions or concerns regarding your invoice.   Our billing staff will not be able to assist you with questions regarding bills from these companies.  You will be contacted with the lab results as soon as they are available. The fastest way to get your results is to activate your My Chart account. Instructions are located on the last page of this paperwork. If you have not heard from [place] regarding the results in 2 weeks, please contact this office.

## 2018-03-12 NOTE — Progress Notes (Signed)
HPI: Wendy Lee is a 57 y.o. female who  has a past medical history of Allergy, Arthritis, Asthma, History of kidney stones, and Sleep apnea.  she presents to [place] at [facility] today, 03/12/18,  for chief complaint of:  Chief Complaint  Patient presents with  . Shoulder Pain    RIGHT  x 4 days and chest painr radiate to back spine are since March 2019 father    Shoulder pain: Main reason for today's visit . Context: No injury.  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 . Location: Right anterior shoulder . Quality: Sharp pain . Duration: 4 days . Modifying factors: Worse with reaching across her chest . Assoc signs/symptoms: back pain and chronic chest pain: See below  Chest pain . Location: Center of chest . Quality: Pressure/tightness.  . Duration: 9 months . Timing: remittent, worse with stress. There is NO chest pain on exertion . Context: Concerned that stress may be a factor, her father recently passed away, was in hospice for what sounds like heart failure.  Her mother was also sick with a heart attack at this time.   Patient states she does not typically go to the doctor unless something is really bothering her because of her shoulder brought her today.  The chest pain to her primary care provider in a while back, and EKG was reportedly normal -records reviewed from that visit 07/28/2017 tracing is NOT available for review      Past medical history, surgical history, and family history reviewed.  Current medication list and allergy/intolerance information reviewed.   (See remainder of HPI, ROS, Phys Exam below)  Dg Chest 2 View  Result Date: 03/12/2018 CLINICAL DATA:  RIGHT shoulder and chest pain EXAM: CHEST - 2 VIEW COMPARISON:  07/09/2013 FINDINGS: Normal heart size, mediastinal contours, and pulmonary vascularity. Lungs clear. No pleural effusion or pneumothorax. Bones unremarkable. IMPRESSION: Normal exam.  Electronically Signed   By: Lavonia Dana M.D.   On: 03/12/2018 10:49   Dg Shoulder Right  Result Date: 03/12/2018 CLINICAL DATA:  RIGHT shoulder pain EXAM: RIGHT SHOULDER - 2+ VIEW COMPARISON:  None FINDINGS: Mild osseous demineralization. AC joint alignment normal. No acute fracture, dislocation, or bone destruction. Visualized RIGHT ribs unremarkable. IMPRESSION: No acute abnormalities. Electronically Signed   By: Lavonia Dana M.D.   On: 03/12/2018 10:48    EKG interpretation: Rate: 72 Rhythm: sinus No ST/T changes concerning for acute ischemia/infarct  Previous EKG no tracings available reportedly normal EKG 07/2017          ASSESSMENT/PLAN:   Other chest pain - Plan: EKG 12-Lead, Exercise Tolerance Test, DG Chest 2 View, Ambulatory referral to Cardiology  Pain in joint of right shoulder - Plan: Ambulatory referral to Sports Medicine, DG Shoulder Right   Meds ordered this encounter  Medications  . naproxen (NAPROSYN) 500 MG tablet    Sig: Take 1 tablet (500 mg total) by mouth 2 (two) times daily with a meal.    Dispense:  60 tablet    Refill:  0  . predniSONE (DELTASONE) 50 MG tablet    Sig: One tab PO daily for 5 days.    Dispense:  5 tablet    Refill:  0    Patient Instructions   Chest pressure symptoms are concerning! I am reassured that there is no chest pain on exertion (angina) BUT if chest pain/pressure worsens, please seek emergency medical attention! We are getting you  set up with a cardiologist for discussion about stress testing.   I think the shoulder problem is due to an impingement/irritation of the biceps tendon or possible inflammation of the bursa which cushions the joint. I'd like to try treatment with anti-inflammatory (Naproxen 500 mg twice daily) and steroid (Prednisone burst) and referral to sports medicine to discuss whether injections might be helpful if this isn't getting better.        Follow-up plan: Return for recheck with your primary  doctor .                                                           ############################################ ############################################ ############################################ ############################################    Outpatient Encounter Medications as of 03/12/2018  Medication Sig Note  . empagliflozin (JARDIANCE) 10 MG TABS tablet Take once a day in the AM   . EPINEPHrine 0.3 mg/0.3 mL IJ SOAJ injection Inject 0.3 mg into the muscle. 03/12/2018: prn  . ibuprofen (ADVIL,MOTRIN) 200 MG tablet Take 800 mg by mouth every 6 (six) hours as needed for moderate pain.   Marland Kitchen lisinopril (PRINIVIL,ZESTRIL) 5 MG tablet Take 5 mg by mouth. 03/12/2018: taking  . meloxicam (MOBIC) 7.5 MG tablet Take 7.5 mg by mouth daily.   . metFORMIN (GLUCOPHAGE) 500 MG tablet Take 500 mg by mouth. 03/12/2018: taking  . pregabalin (LYRICA) 100 MG capsule Take 100 mg by mouth. 03/12/2018: taking  . sulfamethoxazole-trimethoprim (BACTRIM DS,SEPTRA DS) 800-160 MG tablet Take 1 tablet by mouth 2 (two) times daily. (Patient not taking: Reported on 03/12/2018)   . valACYclovir (VALTREX) 1000 MG tablet Take 1 tablet (1,000 mg total) by mouth 3 (three) times daily. (Patient not taking: Reported on 03/12/2018)    No facility-administered encounter medications on file as of 03/12/2018.    Allergies  Allergen Reactions  . Bee Venom Anaphylaxis  . Penicillins Anaphylaxis      Review of Systems:  Constitutional: No recent illness  HEENT: No  headache, no vision change  Cardiac: No  chest pain, No  pressure, No palpitations  Respiratory:  No  shortness of breath. No  Cough  Gastrointestinal: No  abdominal pain, no change on bowel habits  Musculoskeletal: No new myalgia/arthralgia  Skin: No  Rash  Hem/Onc: No  easy bruising/bleeding, No  abnormal lumps/bumps  Neurologic: No  weakness, No  Dizziness  Psychiatric: No   concerns with depression, No  concerns with anxiety  Exam:  BP 125/79   Pulse 70   Temp 98.2 F (36.8 C) (Oral)   Resp 16   Ht 5' 7.5" (1.715 m)   Wt (!) 318 lb (144.2 kg)   LMP 10/20/2013   SpO2 97%   BMI 49.07 kg/m   Constitutional: VS see above. General Appearance: alert, well-developed, well-nourished, NAD  Eyes: Normal lids and conjunctive, non-icteric sclera  Ears, Nose, Mouth, Throat: MMM, Normal external inspection ears/nares/mouth/lips/gums.  Neck: No masses, trachea midline.   Respiratory: Normal respiratory effort. no wheeze, no rhonchi, no rales  Cardiovascular: S1/S2 normal, no murmur, no rub/gallop auscultated. RRR.   Musculoskeletal: Gait normal. Symmetric and independent movement of all extremities R Shoulder Exam Range of motion: normal except cross-over Strength tests:   Empty Can: negative  Drop POE:UMPNTIRW  Liftoff: negative  Yergason: positive  Instability tests  Apprehension: negative Impingement tests  Crossover:  positive  Hawkins:    ExtRot: positive   IntRot: negative  Neer's: negative   Neurological: Normal balance/coordination. No tremor.  Skin: warm, dry, intact.   Psychiatric: Normal judgment/insight. Normal mood and affect. Oriented x3.   Visit summary with medication list and pertinent instructions was printed for patient to review, advised to alert [place] if any changes needed. All questions at time of visit were answered - patient instructed to contact office with any additional concerns. ER/RTC precautions were reviewed with the patient and understanding verbalized.   Follow-up plan: Return for recheck with your primary doctor .  Note: Total time spent 40 minutes, greater than 50% of the visit was spent face-to-face counseling and coordinating care for the following: The primary encounter diagnosis was Other chest pain. A diagnosis of Pain in joint of right shoulder was also pertinent to this visit.Marland Kitchen  Please note: voice  recognition software was used to produce this document, and typos may escape review. Please contact Dr. Sheppard Coil for any needed clarifications.

## 2018-03-25 ENCOUNTER — Ambulatory Visit: Payer: Self-pay | Admitting: Family Medicine

## 2018-04-10 ENCOUNTER — Other Ambulatory Visit: Payer: Self-pay | Admitting: Osteopathic Medicine

## 2018-04-11 NOTE — Telephone Encounter (Signed)
Requested medication (s) are due for refill today: yes  Requested medication (s) are on the active medication list: yes  Last refill:  03/12/18  Future visit scheduled: no  Notes to clinic:  Analgesics: NSAIDS failed  Requested Prescriptions  Pending Prescriptions Disp Refills   naproxen (NAPROSYN) 500 MG tablet [Pharmacy Med Name: NAPROXEN 500MG  TABLETS] 60 tablet 0    Sig: TAKE 1 TABLET(500 MG) BY MOUTH TWICE DAILY WITH A MEAL     Analgesics:  NSAIDS Failed - 04/11/2018  7:28 AM      Failed - Cr in normal range and within 360 days    No results found for: CREATININE       Failed - HGB in normal range and within 360 days    Hemoglobin  Date Value Ref Range Status  11/09/2013 13.9 12.2 - 16.2 g/dL Final  04/21/2012 14.4 12.0 - 15.0 g/dL Final         Passed - Patient is not pregnant      Passed - Valid encounter within last 12 months    Recent Outpatient Visits          1 month ago Other chest pain   [facility], Lanelle Bal, DO   1 year ago Facial rash   [facility] at [place], PA-C   4 years ago Ureterolithiasis   [facility], Fenton Malling, MD   4 years ago LLQ abdominal pain   [facility] at [facility], Fenton Malling, MD   5 years ago Radial head fracture, closed, left, initial encounter   [facility], Janalee Dane, MD

## 2018-04-11 NOTE — Telephone Encounter (Signed)
pls see rx request. Seen by Dr. Sheppard Coil on 03/12/18

## 2018-04-28 ENCOUNTER — Encounter: Payer: Self-pay | Admitting: Cardiology

## 2018-05-16 DIAGNOSIS — I10 Essential (primary) hypertension: Secondary | ICD-10-CM | POA: Diagnosis not present

## 2018-05-16 DIAGNOSIS — Z9114 Patient's other noncompliance with medication regimen: Secondary | ICD-10-CM | POA: Diagnosis not present

## 2018-05-16 DIAGNOSIS — Z79899 Other long term (current) drug therapy: Secondary | ICD-10-CM | POA: Diagnosis not present

## 2018-05-16 DIAGNOSIS — E1142 Type 2 diabetes mellitus with diabetic polyneuropathy: Secondary | ICD-10-CM | POA: Diagnosis not present

## 2018-05-16 DIAGNOSIS — Z7984 Long term (current) use of oral hypoglycemic drugs: Secondary | ICD-10-CM | POA: Diagnosis not present

## 2018-05-16 DIAGNOSIS — E785 Hyperlipidemia, unspecified: Secondary | ICD-10-CM | POA: Diagnosis not present

## 2018-05-16 DIAGNOSIS — E1169 Type 2 diabetes mellitus with other specified complication: Secondary | ICD-10-CM | POA: Diagnosis not present

## 2018-05-16 DIAGNOSIS — E1165 Type 2 diabetes mellitus with hyperglycemia: Secondary | ICD-10-CM | POA: Diagnosis not present

## 2018-05-21 ENCOUNTER — Other Ambulatory Visit: Payer: Self-pay | Admitting: Family Medicine

## 2018-05-23 NOTE — Telephone Encounter (Signed)
Last filled on 04/11/18. No follow up vs on file.

## 2018-05-23 NOTE — Telephone Encounter (Signed)
Requested medication (s) are due for refill today: yes  Requested medication (s) are on the active medication list: yes  Last refill:  04/11/18  Future visit scheduled: no  Notes to clinic:  Need labs    Requested Prescriptions  Pending Prescriptions Disp Refills   naproxen (NAPROSYN) 500 MG tablet [Pharmacy Med Name: NAPROXEN 500MG  TABLETS] 60 tablet 0    Sig: TAKE 1 TABLET(500 MG) BY MOUTH TWICE DAILY WITH A MEAL     Analgesics:  NSAIDS Failed - 05/21/2018 10:25 PM      Failed - Cr in normal range and within 360 days    No results found for: CREATININE       Failed - HGB in normal range and within 360 days    Hemoglobin  Date Value Ref Range Status  11/09/2013 13.9 12.2 - 16.2 g/dL Final  04/21/2012 14.4 12.0 - 15.0 g/dL Final         Passed - Patient is not pregnant      Passed - Valid encounter within last 12 months    Recent Outpatient Visits          2 months ago Other chest pain   [facility], Lanelle Bal, DO   1 year ago Facial rash   [facility] [place], PA-C   4 years ago Ureterolithiasis   [facility] at [facility], Fenton Malling, MD   4 years ago LLQ abdominal pain   Primary Care at [facility], Fenton Malling, MD   5 years ago Radial head fracture, closed, left, initial encounter   [facility], Janalee Dane, MD

## 2018-06-15 DIAGNOSIS — J069 Acute upper respiratory infection, unspecified: Secondary | ICD-10-CM | POA: Diagnosis not present

## 2019-12-12 IMAGING — DX DG CHEST 2V
2 series · 2 of 2 positions shown · non-contrast
Comparison: 07/09/2013

CLINICAL DATA: RIGHT shoulder and chest pain

EXAM:
CHEST - 2 VIEW

[chest pa]
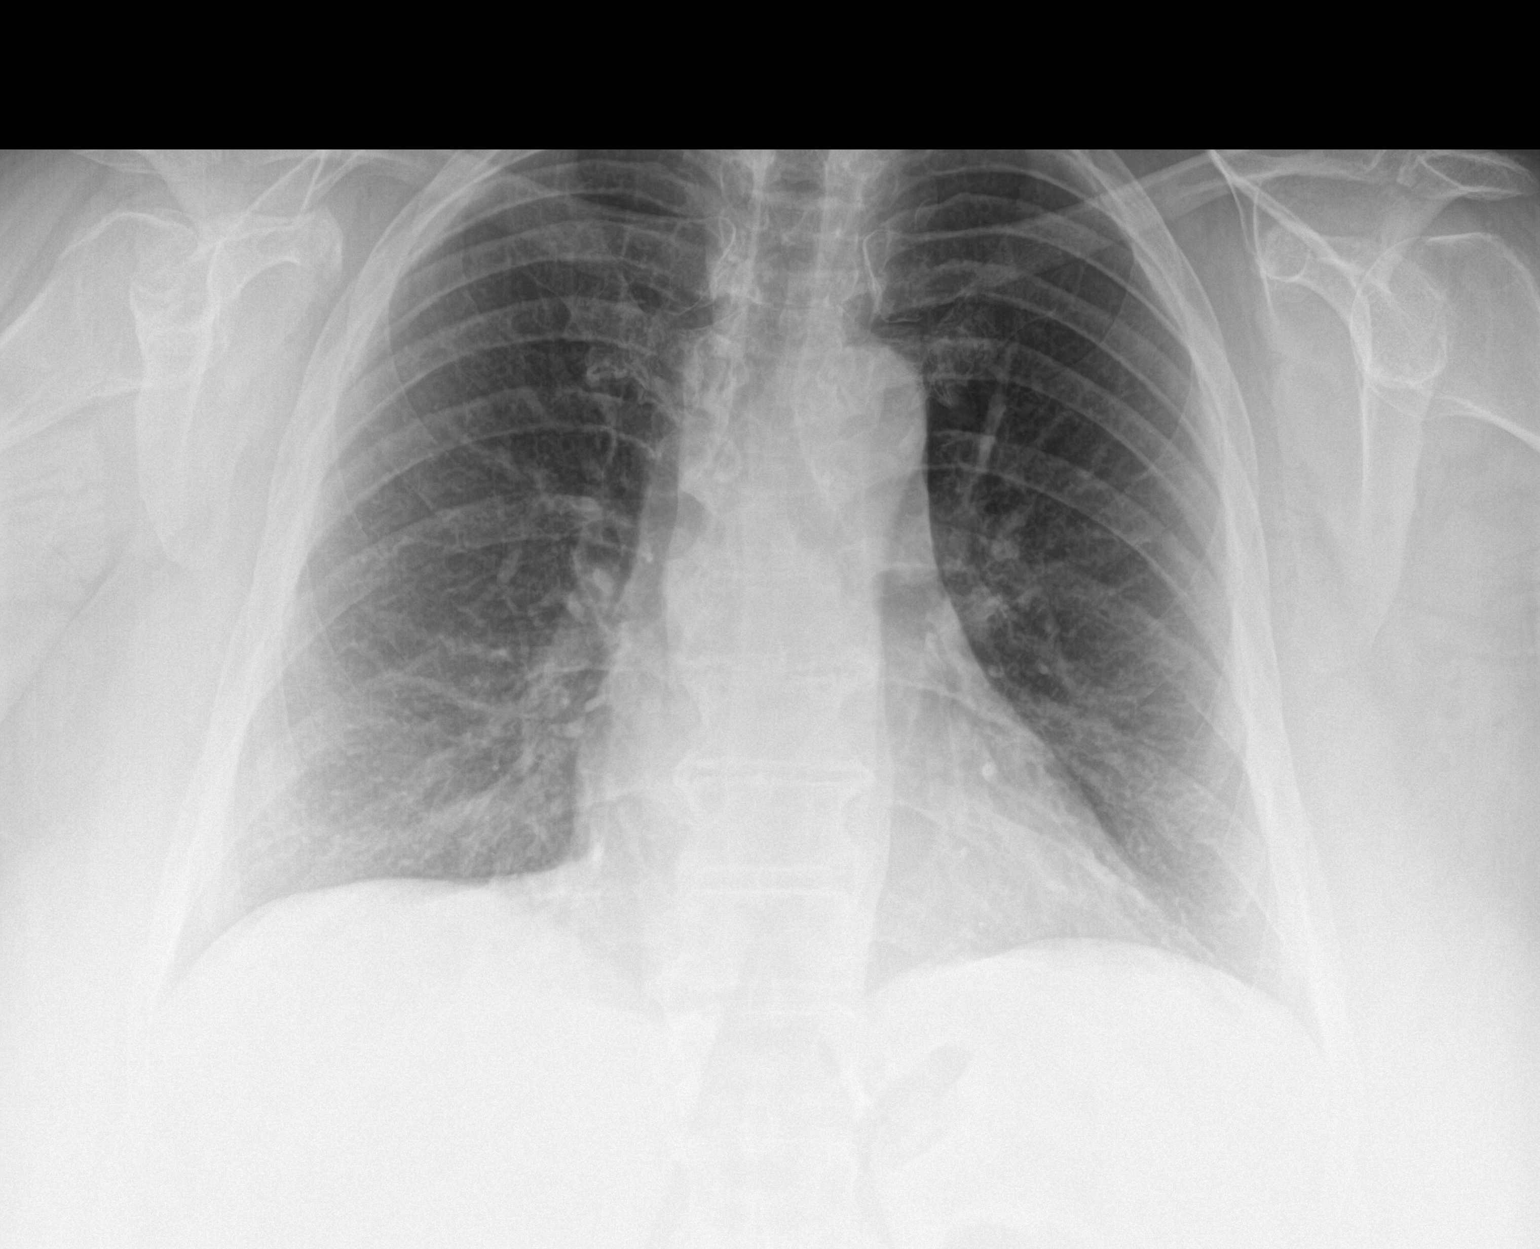

[chest lat]
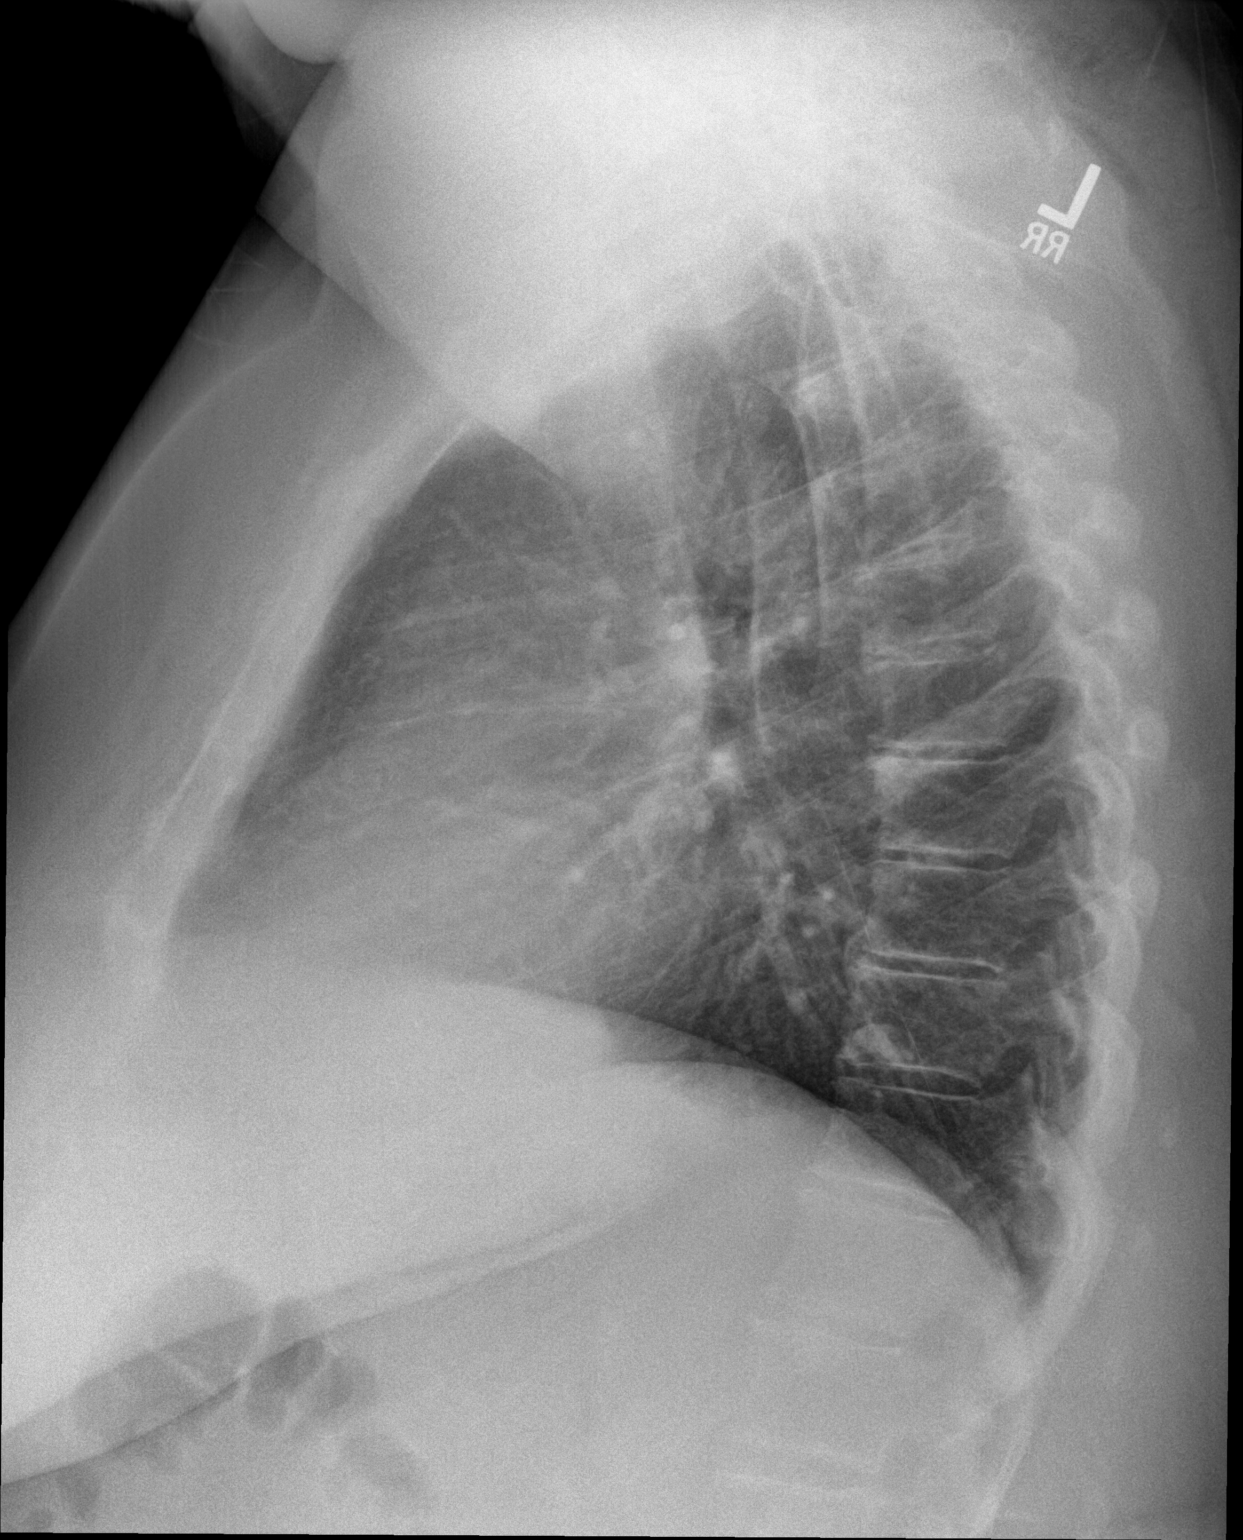

[2 of 2 positions shown; findings below may reference images not displayed]

FINDINGS: Normal heart size, mediastinal contours, and pulmonary vascularity.

Lungs clear.

No pleural effusion or pneumothorax.

Bones unremarkable.
IMPRESSION: Normal exam.

## 2020-07-15 ENCOUNTER — Telehealth: Payer: Self-pay

## 2020-07-24 NOTE — Telephone Encounter (Deleted)
error 

## 2020-07-24 NOTE — Telephone Encounter (Signed)
error
